# Patient Record
Sex: Male | Born: 1965
Health system: Southern US, Community
[De-identification: ages and names within clinical notes are randomized; demographics above are authoritative.]

## PROBLEM LIST (undated history)

## (undated) DIAGNOSIS — D6851 Activated protein C resistance: Secondary | ICD-10-CM

## (undated) DIAGNOSIS — K219 Gastro-esophageal reflux disease without esophagitis: Secondary | ICD-10-CM

## (undated) DIAGNOSIS — T7840XA Allergy, unspecified, initial encounter: Secondary | ICD-10-CM

## (undated) DIAGNOSIS — I739 Peripheral vascular disease, unspecified: Secondary | ICD-10-CM

## (undated) DIAGNOSIS — F419 Anxiety disorder, unspecified: Secondary | ICD-10-CM

## (undated) HISTORY — PX: APPENDECTOMY: SHX54

## (undated) HISTORY — PX: JOINT REPLACEMENT: SHX530

## (undated) HISTORY — DX: Activated protein C resistance: D68.51

## (undated) HISTORY — DX: Allergy, unspecified, initial encounter: T78.40XA

## (undated) HISTORY — PX: OTHER SURGICAL HISTORY: SHX169

---

## 2008-07-16 ENCOUNTER — Emergency Department (HOSPITAL_COMMUNITY): Admission: EM | Admit: 2008-07-16 | Discharge: 2008-07-16 | Payer: Self-pay | Admitting: Emergency Medicine

## 2008-07-17 ENCOUNTER — Emergency Department (HOSPITAL_BASED_OUTPATIENT_CLINIC_OR_DEPARTMENT_OTHER): Admission: EM | Admit: 2008-07-17 | Discharge: 2008-07-17 | Payer: Self-pay | Admitting: Emergency Medicine

## 2008-10-09 ENCOUNTER — Emergency Department (HOSPITAL_COMMUNITY): Admission: EM | Admit: 2008-10-09 | Discharge: 2008-10-09 | Payer: Self-pay | Admitting: Emergency Medicine

## 2011-09-10 ENCOUNTER — Encounter: Payer: Self-pay | Admitting: *Deleted

## 2011-09-10 ENCOUNTER — Emergency Department (HOSPITAL_BASED_OUTPATIENT_CLINIC_OR_DEPARTMENT_OTHER)
Admission: EM | Admit: 2011-09-10 | Discharge: 2011-09-11 | Disposition: A | Discharge status: Home / Self Care | Payer: Managed Care, Other (non HMO) | Attending: Emergency Medicine | Admitting: Emergency Medicine

## 2011-09-10 ENCOUNTER — Emergency Department (INDEPENDENT_AMBULATORY_CARE_PROVIDER_SITE_OTHER): Payer: Managed Care, Other (non HMO)

## 2011-09-10 DIAGNOSIS — M7989 Other specified soft tissue disorders: Secondary | ICD-10-CM

## 2011-09-10 DIAGNOSIS — M79609 Pain in unspecified limb: Secondary | ICD-10-CM

## 2011-09-10 DIAGNOSIS — K219 Gastro-esophageal reflux disease without esophagitis: Secondary | ICD-10-CM | POA: Insufficient documentation

## 2011-09-10 DIAGNOSIS — L539 Erythematous condition, unspecified: Secondary | ICD-10-CM

## 2011-09-10 DIAGNOSIS — I809 Phlebitis and thrombophlebitis of unspecified site: Secondary | ICD-10-CM

## 2011-09-10 HISTORY — DX: Anxiety disorder, unspecified: F41.9

## 2011-09-10 HISTORY — DX: Gastro-esophageal reflux disease without esophagitis: K21.9
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
## 2011-09-10 NOTE — ED Notes (Signed)
MD in to evaluate pt now.

## 2011-09-10 NOTE — ED Notes (Signed)
Pt c/o right lower leg pain w/ "knot" to calf x 3 days

## 2011-09-18 ENCOUNTER — Emergency Department (HOSPITAL_BASED_OUTPATIENT_CLINIC_OR_DEPARTMENT_OTHER)
Admission: EM | Admit: 2011-09-18 | Discharge: 2011-09-18 | Disposition: A | Discharge status: Home / Self Care | Payer: Managed Care, Other (non HMO) | Attending: Emergency Medicine | Admitting: Emergency Medicine

## 2011-09-18 ENCOUNTER — Emergency Department (INDEPENDENT_AMBULATORY_CARE_PROVIDER_SITE_OTHER): Payer: Managed Care, Other (non HMO)

## 2011-09-18 ENCOUNTER — Encounter (HOSPITAL_BASED_OUTPATIENT_CLINIC_OR_DEPARTMENT_OTHER): Payer: Self-pay

## 2011-09-18 DIAGNOSIS — M79609 Pain in unspecified limb: Secondary | ICD-10-CM

## 2011-09-18 DIAGNOSIS — I8000 Phlebitis and thrombophlebitis of superficial vessels of unspecified lower extremity: Secondary | ICD-10-CM

## 2011-09-18 DIAGNOSIS — I809 Phlebitis and thrombophlebitis of unspecified site: Secondary | ICD-10-CM | POA: Insufficient documentation

## 2011-09-18 DIAGNOSIS — Z86718 Personal history of other venous thrombosis and embolism: Secondary | ICD-10-CM | POA: Insufficient documentation

## 2011-09-18 DIAGNOSIS — Z79899 Other long term (current) drug therapy: Secondary | ICD-10-CM | POA: Insufficient documentation

## 2011-09-18 DIAGNOSIS — D6851 Activated protein C resistance: Secondary | ICD-10-CM

## 2011-09-18 DIAGNOSIS — D6859 Other primary thrombophilia: Secondary | ICD-10-CM | POA: Insufficient documentation

## 2011-09-18 DIAGNOSIS — M7989 Other specified soft tissue disorders: Secondary | ICD-10-CM

## 2011-09-18 MED ORDER — HYDROCODONE-ACETAMINOPHEN 5-325 MG PO TABS: 1.0000 | ORAL_TABLET | Freq: Four times a day (QID) | ORAL | Status: AC | PRN

## 2011-09-18 NOTE — ED Notes (Signed)
Pt returned from US

## 2011-09-18 NOTE — ED Notes (Signed)
Pt reports he has a "clot in right leg".  States he was seen last week and pain is worse today.

## 2011-09-18 NOTE — ED Notes (Signed)
Dr Lynelle Doctor at bedside. Report received from [facility] RN. Assuming care of patient at this time

## 2011-09-18 NOTE — ED Provider Notes (Signed)
History     CSN: [id number] Arrival date & time: 09/18/2011  6:55 PM   First MD Initiated Contact with Patient 09/18/11 1914      Chief Complaint  Patient presents with  . Leg Pain  . DVT    (Consider location/radiation/quality/duration/timing/severity/associated sxs/prior treatment) HPI Patient presents to the emergency room with complaints of swelling and pain in his right leg. Patient has a history of factor V Leiden deficiency. He's been having trouble with swelling and pain in his right calf. He was seen in the emergency room  about a week ago and was diagnosed with a superficial thrombophlebitis.  Patient states the pain has been getting worse. He does not have a primary doctor or hematologist now. Patient denies any chest pain or shortness of breath. He denies any fever.   Past Medical History  Diagnosis Date  . GERD (gastroesophageal reflux disease)   . Factor IX (functional) deficiency   . Anxiety     Past Surgical History  Procedure Date  . Joint replacement   . Appendectomy     No family history on file.  History  Substance Use Topics  . Smoking status: Never Smoker   . Smokeless tobacco: Not on file  . Alcohol Use: No      Review of Systems  All other systems reviewed and are negative.    Allergies  Review of patient's allergies indicates no known allergies.  Home Medications   Current Outpatient Rx  Name Route Sig Dispense Refill  . ALPRAZOLAM 0.5 MG PO TABS Oral Take 0.5 mg by mouth daily as needed. For anxiety     . ASPIRIN 325 MG PO TBEC Oral Take 650 mg by mouth daily.      Marland Kitchen ONE-DAILY MULTI VITAMINS PO TABS Oral Take 1 tablet by mouth daily.      Marland Kitchen PANTOPRAZOLE SODIUM 40 MG PO TBEC Oral Take 40 mg by mouth daily.      Marland Kitchen ZOLPIDEM TARTRATE 10 MG PO TABS Oral Take 5 mg by mouth at bedtime as needed. For sleep       BP 125/92  Pulse 70  Temp(Src) 98.3 F (36.8 C) (Oral)  Resp 16  Ht 5\' 8"  (1.727 m)  Wt 190 lb (86.183 kg)  BMI 28.89  kg/m2  SpO2 100%  Physical Exam  Nursing note and vitals reviewed. Constitutional: He appears well-developed and well-nourished. No distress.  HENT:  Head: Normocephalic and atraumatic.  Right Ear: External ear normal.  Left Ear: External ear normal.  Eyes: Conjunctivae are normal. Right eye exhibits no discharge. Left eye exhibits no discharge. No scleral icterus.  Neck: Neck supple. No tracheal deviation present.  Cardiovascular: Normal rate, regular rhythm and intact distal pulses.   Pulmonary/Chest: Effort normal and breath sounds normal. No stridor. No respiratory distress. He has no wheezes. He has no rales.  Abdominal: Soft. Bowel sounds are normal. He exhibits no distension. There is no tenderness. There is no rebound and no guarding.  Musculoskeletal: He exhibits tenderness. He exhibits no edema.       Varicose veins noted bilateral lower extremities, tenderness palpation right calf, superficial thrombophlebitis palpated, no erythema  Neurological: He is alert. He has normal strength. No sensory deficit. Cranial nerve deficit:  no gross defecits noted. He exhibits normal muscle tone. He displays no seizure activity. Coordination normal.  Skin: Skin is warm and dry. No rash noted.  Psychiatric: He has a normal mood and affect.    ED Course  Procedures (  including critical care time)  Labs Reviewed - No data to display US Venous Img Lower Unilateral Right  09/18/2011  *RADIOLOGY REPORT*  Clinical Data: PAIN, SWELLING, RECENT SUPERFICIAL THROMBO BUT GETTING WORSE;;  RIGHT LOWER EXTREMITY VENOUS DUPLEX ULTRASOUND  Technique: Gray-scale sonography with compression, as well as color and duplex ultrasound, were performed to evaluate the deep venous system from the level of the common femoral vein through the popliteal and proximal calf veins.  Comparison: None  Findings:  Normal compressibility and normal Doppler signal within the common femoral, superficial femoral and popliteal veins,  down to the proximal calf veins.  No grayscale filling defects to suggest DVT.  Thrombosed superficial vein noted posteriorly in the calf, most compatible with thrombosed short saphenous vein (formally lesser saphenous vein).  IMPRESSION: No evidence of right lower extremity deep vein thrombosis.  Superficial thrombophlebitis of the right posterior calf short saphenous vein.  Original Report Authenticated By: Cyndie Chime, M.D.   US Venous Img Lower Unilateral Right  09/10/2011  *RADIOLOGY REPORT*  Clinical Data: Right calf pain, swelling and erythema.  History of IVC filter.  Question DVT.  RIGHT LOWER EXTREMITY VENOUS DUPLEX ULTRASOUND  Technique:  Gray-scale sonography with graded compression, as well as color Doppler and duplex ultrasound were performed to evaluate the deep venous system of the lower extremity from the level of the common femoral vein through the popliteal and proximal calf veins. Spectral Doppler was utilized to evaluate flow at rest and with distal augmentation maneuvers.  Comparison:  None.  Findings:  Normal compressibility of the common femoral, superficial femoral, and popliteal veins is demonstrated, as well as the visualized proximal calf veins.  No filling defects to suggest DVT on grayscale or color Doppler imaging.  Doppler waveforms show normal direction of venous flow, normal respiratory phasicity and response to augmentation.  IMPRESSION: No evidence of right lower extremity deep vein thrombosis.  Original Report Authenticated By: Gerrianne Scale, M.D.    1. Superficial thrombophlebitis   2. Factor V Leiden       MDM  Patient does not have any signs of deep venous thrombosis. He continues to have however a superficial thrombophlebitis. She will continue the aspirin. Considering his history of factor V Leiden I do recommend a followup with a hematologist.        Celene Kras, MD 09/18/11 2049

## 2011-11-21 ENCOUNTER — Ambulatory Visit (HOSPITAL_BASED_OUTPATIENT_CLINIC_OR_DEPARTMENT_OTHER): Payer: Managed Care, Other (non HMO) | Attending: Emergency Medicine

## 2011-11-21 VITALS — Ht 68.0 in | Wt 190.0 lb

## 2011-11-21 DIAGNOSIS — G4733 Obstructive sleep apnea (adult) (pediatric): Secondary | ICD-10-CM

## 2011-11-24 DIAGNOSIS — R0609 Other forms of dyspnea: Secondary | ICD-10-CM

## 2011-11-24 DIAGNOSIS — G4733 Obstructive sleep apnea (adult) (pediatric): Secondary | ICD-10-CM

## 2011-11-24 DIAGNOSIS — R0989 Other specified symptoms and signs involving the circulatory and respiratory systems: Secondary | ICD-10-CM

## 2011-11-25 NOTE — Procedures (Signed)
NAME:  Andrew Goodman, Andrew Goodman            ACCOUNT NO.:  [id number]  MEDICAL RECORD NO.:  [id number]          PATIENT TYPE:  OUT  LOCATION:  SLEEP CENTER                 FACILITY[facility]  [facility]  PHYSICIAN:  Yanet Balliet D. Maple Hudson, MD, FCCP, FACPDATE OF BIRTH:  05-31-1965  DATE OF STUDY:                           NOCTURNAL POLYSOMNOGRAM  REFERRING PHYSICIAN:  JOHN LONGPHRE  INDICATION FOR STUDY:  Insomnia with sleep apnea.  EPWORTH SLEEPINESS SCORE:  4/24.  BMI 28.  Weight 190 pounds.  Height 68 inches.  Neck 16 inch.  MEDICATIONS:  Home medications are charted and reviewed.  SLEEP ARCHITECTURE:  Total sleep time 333.5 minutes with sleep efficiency 90.4%.  Stage I was 4.9%, stage II 82.3%, stage III absent. REM 12.7% of total sleep time.  Sleep latency 2.5 minutes.  REM latency 95.5 minutes.  Awake after sleep onset 33 minutes.  Arousal index 5.9.  BEDTIME MEDICATION:  None.  RESPIRATORY DATA:  Apnea-hypopnea index (AHI) 6.1 per hour.  A total of 34 events were scored, all as hypopneas associated with supine sleep position, and REM.  REM AHI 18.4 per hour.  There were insufficient numbers of events to permit application of split protocol CPAP titration on the study night.  OXYGEN DATA:  Moderately loud snoring with oxygen desaturation to a nadir of 82% and a mean oxygen saturation through the study of 93.2% on room air.  CARDIAC DATA:  Normal sinus rhythm.  MOVEMENT-PARASOMNIA:  No significant movement disturbance.  Bathroom x1.  IMPRESSIONS-RECOMMENDATIONS: 1. Mild obstructive sleep apnea-hypopnea syndrome, apnea-hypopnea     index 6.1 per hour with supine events, particularly frequent in     rapid eye movement.  Rapid Eye Movement apnea-hypopnea index 18.4     per hour.  Moderately loud snoring with oxygen desaturation to a     nadir of 82% and a mean oxygen saturation through the study of     93.2% on room air. 2. Scores in this range are not usually addressed with CPAP unless  conservative measures are insufficient.  Consider management for     nasal obstruction if appropriate, encouragement to sleep off flat     of back.     Angelise Petrich D. Maple Hudson, MD, [facility], FACP Diplomate, American Board of Sleep Medicine    CDY/MEDQ  D:  11/24/2011 12:31:17  T:  11/25/2011 03:20:38  Job:  [id number]

## 2013-01-05 ENCOUNTER — Encounter: Payer: Self-pay | Admitting: Cardiology

## 2013-01-05 ENCOUNTER — Encounter: Payer: Self-pay | Admitting: *Deleted

## 2013-01-05 DIAGNOSIS — D67 Hereditary factor IX deficiency: Secondary | ICD-10-CM | POA: Insufficient documentation

## 2013-01-05 DIAGNOSIS — K219 Gastro-esophageal reflux disease without esophagitis: Secondary | ICD-10-CM | POA: Insufficient documentation

## 2013-01-05 DIAGNOSIS — F419 Anxiety disorder, unspecified: Secondary | ICD-10-CM | POA: Insufficient documentation

## 2013-01-06 ENCOUNTER — Encounter: Payer: Self-pay | Admitting: Cardiology

## 2013-01-06 ENCOUNTER — Ambulatory Visit (INDEPENDENT_AMBULATORY_CARE_PROVIDER_SITE_OTHER): Payer: Managed Care, Other (non HMO) | Admitting: Cardiology

## 2013-01-06 VITALS — BP 124/78 | HR 75 | Wt 195.0 lb

## 2013-01-06 DIAGNOSIS — R079 Chest pain, unspecified: Secondary | ICD-10-CM

## 2013-01-06 NOTE — Patient Instructions (Addendum)
Your physician recommends that you schedule a follow-up appointment in: AS NEEDED  Your physician recommends that you HAVE LAB WORK TODAY 

## 2013-01-06 NOTE — Assessment & Plan Note (Signed)
Symptoms are not consistent with cardiac etiology.he does have a history of factor V Leiden. I doubt pulmonary embolus. However we will arrange a d-dimer. If elevated we will pursue a CT scan. Otherwise unless he has further symptoms I would recommend no further evaluation.
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
## 2013-01-07 ENCOUNTER — Other Ambulatory Visit: Payer: Self-pay | Admitting: *Deleted

## 2013-01-07 ENCOUNTER — Telehealth: Payer: Self-pay | Admitting: Cardiology

## 2013-01-07 ENCOUNTER — Other Ambulatory Visit (INDEPENDENT_AMBULATORY_CARE_PROVIDER_SITE_OTHER): Payer: Managed Care, Other (non HMO)

## 2013-01-07 DIAGNOSIS — R7989 Other specified abnormal findings of blood chemistry: Secondary | ICD-10-CM

## 2013-01-07 DIAGNOSIS — R791 Abnormal coagulation profile: Secondary | ICD-10-CM

## 2013-01-07 LAB — BASIC METABOLIC PANEL
CO2: 27 mEq/L (ref 19–32)
GFR: 95.74 mL/min (ref >60.00)
Glucose, Bld: 129 mg/dL — ABNORMAL HIGH (ref 70–99)
Potassium: 3.5 mEq/L (ref 3.5–5.1)
Sodium: 136 mEq/L (ref 135–145)

## 2013-01-07 NOTE — Telephone Encounter (Signed)
Follow up call   Returning call back to nurse  

## 2013-01-07 NOTE — Telephone Encounter (Signed)
Spoke with pt, he will come by the office today for a stat bmp. Once those results are received the CTA will be scheduled.

## 2013-01-08 ENCOUNTER — Ambulatory Visit (INDEPENDENT_AMBULATORY_CARE_PROVIDER_SITE_OTHER)
Admission: RE | Admit: 2013-01-08 | Discharge: 2013-01-08 | Disposition: A | Discharge status: Home / Self Care | Payer: Managed Care, Other (non HMO) | Source: Ambulatory Visit | Attending: Cardiology | Admitting: Cardiology

## 2013-01-08 DIAGNOSIS — R7989 Other specified abnormal findings of blood chemistry: Secondary | ICD-10-CM

## 2013-01-08 DIAGNOSIS — R791 Abnormal coagulation profile: Secondary | ICD-10-CM

## 2013-01-08 MED ORDER — IOHEXOL 350 MG/ML SOLN
80.0000 mL | Freq: Once | INTRAVENOUS | Status: AC | PRN
  Administered 2013-01-08: 80 mL via INTRAVENOUS

## 2013-01-08 NOTE — Telephone Encounter (Signed)
Spoke with pt, bmp was normal. The pt will come to the office today @ 3:30PM for CTA of chest for PE

## 2017-02-08 DIAGNOSIS — J309 Allergic rhinitis, unspecified: Secondary | ICD-10-CM | POA: Diagnosis not present

## 2017-02-18 DIAGNOSIS — J309 Allergic rhinitis, unspecified: Secondary | ICD-10-CM | POA: Diagnosis not present

## 2017-02-18 DIAGNOSIS — Z7901 Long term (current) use of anticoagulants: Secondary | ICD-10-CM | POA: Diagnosis not present

## 2017-02-18 DIAGNOSIS — D6851 Activated protein C resistance: Secondary | ICD-10-CM | POA: Diagnosis not present

## 2017-02-18 DIAGNOSIS — K219 Gastro-esophageal reflux disease without esophagitis: Secondary | ICD-10-CM | POA: Diagnosis not present

## 2017-02-18 DIAGNOSIS — F4010 Social phobia, unspecified: Secondary | ICD-10-CM | POA: Diagnosis not present

## 2017-04-13 DIAGNOSIS — D6851 Activated protein C resistance: Secondary | ICD-10-CM | POA: Diagnosis not present

## 2017-04-13 DIAGNOSIS — M79604 Pain in right leg: Secondary | ICD-10-CM | POA: Diagnosis not present

## 2017-04-13 DIAGNOSIS — I803 Phlebitis and thrombophlebitis of lower extremities, unspecified: Secondary | ICD-10-CM | POA: Diagnosis not present

## 2017-04-13 DIAGNOSIS — I82403 Acute embolism and thrombosis of unspecified deep veins of lower extremity, bilateral: Secondary | ICD-10-CM | POA: Diagnosis not present

## 2017-04-13 DIAGNOSIS — M79605 Pain in left leg: Secondary | ICD-10-CM | POA: Diagnosis not present

## 2017-04-13 DIAGNOSIS — G8918 Other acute postprocedural pain: Secondary | ICD-10-CM | POA: Diagnosis not present

## 2017-04-13 DIAGNOSIS — I82401 Acute embolism and thrombosis of unspecified deep veins of right lower extremity: Secondary | ICD-10-CM | POA: Diagnosis not present

## 2017-04-13 DIAGNOSIS — M7989 Other specified soft tissue disorders: Secondary | ICD-10-CM | POA: Diagnosis not present

## 2017-04-13 DIAGNOSIS — I82402 Acute embolism and thrombosis of unspecified deep veins of left lower extremity: Secondary | ICD-10-CM | POA: Diagnosis not present

## 2017-04-13 DIAGNOSIS — R600 Localized edema: Secondary | ICD-10-CM | POA: Diagnosis not present

## 2017-04-29 ENCOUNTER — Emergency Department (HOSPITAL_COMMUNITY)
Admission: EM | Admit: 2017-04-29 | Discharge: 2017-04-30 | Disposition: A | Discharge status: Home / Self Care | Payer: BLUE CROSS/BLUE SHIELD | Attending: Emergency Medicine | Admitting: Emergency Medicine

## 2017-04-29 ENCOUNTER — Encounter (HOSPITAL_COMMUNITY): Payer: Self-pay | Admitting: Emergency Medicine

## 2017-04-29 DIAGNOSIS — I824Z2 Acute embolism and thrombosis of unspecified deep veins of left distal lower extremity: Secondary | ICD-10-CM | POA: Diagnosis not present

## 2017-04-29 DIAGNOSIS — I82409 Acute embolism and thrombosis of unspecified deep veins of unspecified lower extremity: Secondary | ICD-10-CM | POA: Diagnosis not present

## 2017-04-29 DIAGNOSIS — Z79899 Other long term (current) drug therapy: Secondary | ICD-10-CM | POA: Insufficient documentation

## 2017-04-29 DIAGNOSIS — D682 Hereditary deficiency of other clotting factors: Secondary | ICD-10-CM | POA: Diagnosis not present

## 2017-04-29 DIAGNOSIS — I82402 Acute embolism and thrombosis of unspecified deep veins of left lower extremity: Secondary | ICD-10-CM | POA: Diagnosis not present

## 2017-04-29 DIAGNOSIS — M79605 Pain in left leg: Secondary | ICD-10-CM | POA: Diagnosis present

## 2017-04-29 LAB — CBC WITH DIFFERENTIAL/PLATELET
BASOS PCT: 0 %
Basophils Absolute: 0 10*3/uL (ref 0.0–0.1)
EOS ABS: 0.4 10*3/uL (ref 0.0–0.7)
Eosinophils Relative: 5 %
HCT: 42.9 % (ref 39.0–52.0)
Hemoglobin: 14.4 g/dL (ref 13.0–17.0)
LYMPHS ABS: 2.2 10*3/uL (ref 0.7–4.0)
LYMPHS PCT: 32 %
MCH: 29.2 pg (ref 26.0–34.0)
MCHC: 33.6 g/dL (ref 30.0–36.0)
MCV: 87 fL (ref 78.0–100.0)
MONO ABS: 0.5 10*3/uL (ref 0.1–1.0)
Monocytes Relative: 7 %
NEUTROS ABS: 3.9 10*3/uL (ref 1.7–7.7)
Neutrophils Relative %: 56 %
Platelets: 220 10*3/uL (ref 150–400)
RBC: 4.93 MIL/uL (ref 4.22–5.81)
RDW: 13.9 % (ref 11.5–15.5)
WBC: 6.9 10*3/uL (ref 4.0–10.5)

## 2017-04-29 LAB — COMPREHENSIVE METABOLIC PANEL
ALBUMIN: 4 g/dL (ref 3.5–5.0)
ALK PHOS: 64 U/L (ref 38–126)
ALT: 20 U/L (ref 17–63)
ANION GAP: 8 (ref 5–15)
AST: 22 U/L (ref 15–41)
BUN: 12 mg/dL (ref 6–20)
CHLORIDE: 105 mmol/L (ref 101–111)
CO2: 24 mmol/L (ref 22–32)
Calcium: 8.7 mg/dL — ABNORMAL LOW (ref 8.9–10.3)
Creatinine, Ser: 0.96 mg/dL (ref 0.61–1.24)
GFR calc Af Amer: >60 mL/min (ref >60)
GFR calc non Af Amer: >60 mL/min (ref >60)
GLUCOSE: 126 mg/dL — AB (ref 65–99)
POTASSIUM: 3.8 mmol/L (ref 3.5–5.1)
SODIUM: 137 mmol/L (ref 135–145)
Total Bilirubin: 0.5 mg/dL (ref 0.3–1.2)
Total Protein: 6.9 g/dL (ref 6.5–8.1)

## 2017-04-29 LAB — PROTIME-INR
INR: 0.93
Prothrombin Time: 12.5 seconds (ref 11.4–15.2)

## 2017-04-29 LAB — APTT: aPTT: 29 seconds (ref 24–36)
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
## 2017-04-29 NOTE — ED Triage Notes (Signed)
Pt [facility] ED [facility] [place] [facility]  Pt has Factor V and hx of clots, has a groin stent on left and right, plus a central filter.  Pt is experiencing pain to the left groin, left thigh and lower abdomen, mild in nature, but [facility] [place] [facility] [place].  Has been unable to get in with PCP.  Pt denies CP, SOB, HR stable.  Pt is on Eliquis/

## 2017-04-30 ENCOUNTER — Emergency Department (HOSPITAL_COMMUNITY)
Admission: EM | Admit: 2017-04-30 | Discharge: 2017-04-30 | Disposition: A | Discharge status: Home / Self Care | Payer: BLUE CROSS/BLUE SHIELD | Source: Home / Self Care | Attending: Emergency Medicine | Admitting: Emergency Medicine

## 2017-04-30 ENCOUNTER — Telehealth: Payer: Self-pay | Admitting: Surgery

## 2017-04-30 ENCOUNTER — Encounter (HOSPITAL_COMMUNITY): Payer: Self-pay | Admitting: Emergency Medicine

## 2017-04-30 ENCOUNTER — Other Ambulatory Visit: Payer: Self-pay

## 2017-04-30 ENCOUNTER — Emergency Department (HOSPITAL_BASED_OUTPATIENT_CLINIC_OR_DEPARTMENT_OTHER)
Admit: 2017-04-30 | Discharge: 2017-04-30 | Disposition: A | Discharge status: Home / Self Care | Payer: BLUE CROSS/BLUE SHIELD | Attending: Emergency Medicine | Admitting: Emergency Medicine

## 2017-04-30 ENCOUNTER — Other Ambulatory Visit: Payer: Self-pay | Admitting: *Deleted

## 2017-04-30 DIAGNOSIS — Z7901 Long term (current) use of anticoagulants: Secondary | ICD-10-CM | POA: Insufficient documentation

## 2017-04-30 DIAGNOSIS — I82422 Acute embolism and thrombosis of left iliac vein: Secondary | ICD-10-CM

## 2017-04-30 DIAGNOSIS — Z95828 Presence of other vascular implants and grafts: Secondary | ICD-10-CM

## 2017-04-30 DIAGNOSIS — Z79899 Other long term (current) drug therapy: Secondary | ICD-10-CM

## 2017-04-30 DIAGNOSIS — Z86718 Personal history of other venous thrombosis and embolism: Secondary | ICD-10-CM | POA: Diagnosis not present

## 2017-04-30 DIAGNOSIS — I82402 Acute embolism and thrombosis of unspecified deep veins of left lower extremity: Secondary | ICD-10-CM | POA: Insufficient documentation

## 2017-04-30 DIAGNOSIS — M7989 Other specified soft tissue disorders: Secondary | ICD-10-CM

## 2017-04-30 DIAGNOSIS — I824Z2 Acute embolism and thrombosis of unspecified deep veins of left distal lower extremity: Secondary | ICD-10-CM | POA: Diagnosis not present

## 2017-04-30 MED ORDER — ENOXAPARIN SODIUM 100 MG/ML ~~LOC~~ SOLN
90.0000 mg | Freq: Once | SUBCUTANEOUS | Status: AC
  Administered 2017-04-30: 90 mg via SUBCUTANEOUS
  Filled 2017-04-30: qty 1

## 2017-04-30 MED ORDER — APIXABAN 5 MG PO TABS: ORAL_TABLET | ORAL | 0 refills | Status: DC

## 2017-04-30 NOTE — ED Notes (Signed)
Instructed pt to report to Vascular. Noreene LarssonJill, from the vascular lab called and is ready for the pt.

## 2017-04-30 NOTE — Telephone Encounter (Signed)
New pt appt was already sched for 06/06/17 at 9:00. Added the aorto-illiac and IVC 06/04/17 at 8:30.

## 2017-04-30 NOTE — Telephone Encounter (Signed)
-----   Message from Sharee PimpleMarilyn K McChesney, RN sent at 04/30/2017 12:07 PM EDT ----- Regarding: new patient appt with 2 labs   ----- Message ----- From: Nada LibmanBrabham, Vance W, MD Sent: 04/30/2017  11:37 AM To: Vvs Charge Pool  Please have patient come in for a new patient eval for recurrent DVT and Factor V leiden mutation.  History of iliac vein stents and IVC filter.  Needs Iliac vein and IVC u/c prior

## 2017-04-30 NOTE — ED Triage Notes (Signed)
Pt. Stated, I was here yesterday and there was noone here to do the study and told to come back this morning. [facility] and told to follow up at home.
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
## 2017-04-30 NOTE — Progress Notes (Signed)
*  PRELIMINARY RESULTS* Vascular Ultrasound Left lower extremity venous duplex has been completed.  Preliminary findings: DVT noted in the left popliteal, posterior tibial, and peroneal veins. Intramuscular thrombosis of the left gastroc veins. Superficial thrombosis of varicose veins in the calf.   [facility] that I will bring patient back to ED waiting room.   Farrel DemarkJill Eunice, RDMS, RVT  04/30/2017, 8:39 AM

## 2017-04-30 NOTE — Discharge Instructions (Signed)
Return to the emergency department tomorrow morning for venous duplex and ed management.   Do not hesitate to return to the emergency department for any new, worsening or concerning symptoms.

## 2017-05-24 DIAGNOSIS — Z125 Encounter for screening for malignant neoplasm of prostate: Secondary | ICD-10-CM | POA: Diagnosis not present

## 2017-05-24 DIAGNOSIS — Z13220 Encounter for screening for lipoid disorders: Secondary | ICD-10-CM | POA: Diagnosis not present

## 2017-05-24 DIAGNOSIS — Z0000 Encounter for general adult medical examination without abnormal findings: Secondary | ICD-10-CM | POA: Diagnosis not present

## 2017-05-30 ENCOUNTER — Encounter: Payer: Self-pay | Admitting: Vascular Surgery

## 2017-06-04 ENCOUNTER — Ambulatory Visit (HOSPITAL_COMMUNITY)
Admission: RE | Admit: 2017-06-04 | Discharge: 2017-06-04 | Disposition: A | Discharge status: Home / Self Care | Payer: BLUE CROSS/BLUE SHIELD | Source: Ambulatory Visit | Attending: Vascular Surgery | Admitting: Vascular Surgery

## 2017-06-04 ENCOUNTER — Encounter (HOSPITAL_COMMUNITY): Payer: BLUE CROSS/BLUE SHIELD

## 2017-06-04 DIAGNOSIS — Z95828 Presence of other vascular implants and grafts: Secondary | ICD-10-CM

## 2017-06-06 ENCOUNTER — Ambulatory Visit (INDEPENDENT_AMBULATORY_CARE_PROVIDER_SITE_OTHER): Payer: BLUE CROSS/BLUE SHIELD | Admitting: Vascular Surgery

## 2017-06-06 ENCOUNTER — Encounter: Payer: Self-pay | Admitting: Vascular Surgery

## 2017-06-06 VITALS — BP 131/86 | HR 67 | Temp 97.0°F | Ht 68.0 in | Wt 196.0 lb

## 2017-06-06 DIAGNOSIS — I872 Venous insufficiency (chronic) (peripheral): Secondary | ICD-10-CM | POA: Diagnosis not present
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
## 2017-06-12 NOTE — Addendum Note (Signed)
Addended by: Burton ApleyPETTY, Hani Patnode A on: 06/12/2017 03:47 PM   Modules accepted: Orders

## 2018-06-04 ENCOUNTER — Ambulatory Visit: Payer: 59 | Admitting: Vascular Surgery

## 2018-06-04 ENCOUNTER — Encounter: Payer: Self-pay | Admitting: Vascular Surgery

## 2018-06-04 ENCOUNTER — Other Ambulatory Visit: Payer: Self-pay

## 2018-06-04 VITALS — BP 122/92 | HR 67 | Temp 98.4°F | Resp 16 | Ht 68.0 in | Wt 201.0 lb

## 2018-06-04 DIAGNOSIS — I872 Venous insufficiency (chronic) (peripheral): Secondary | ICD-10-CM | POA: Diagnosis not present
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
## 2019-05-01 ENCOUNTER — Other Ambulatory Visit: Payer: Self-pay | Admitting: Vascular Surgery

## 2019-05-01 DIAGNOSIS — I872 Venous insufficiency (chronic) (peripheral): Secondary | ICD-10-CM

## 2019-05-01 DIAGNOSIS — Z95828 Presence of other vascular implants and grafts: Secondary | ICD-10-CM

## 2019-05-08 ENCOUNTER — Other Ambulatory Visit: Payer: Self-pay | Admitting: Vascular Surgery

## 2019-05-08 DIAGNOSIS — I872 Venous insufficiency (chronic) (peripheral): Secondary | ICD-10-CM

## 2019-05-08 DIAGNOSIS — Z95828 Presence of other vascular implants and grafts: Secondary | ICD-10-CM

## 2019-05-08 DIAGNOSIS — I82422 Acute embolism and thrombosis of left iliac vein: Secondary | ICD-10-CM

## 2019-05-22 ENCOUNTER — Ambulatory Visit
Admission: RE | Admit: 2019-05-22 | Discharge: 2019-05-22 | Disposition: A | Discharge status: Home / Self Care | Payer: 59 | Source: Ambulatory Visit | Attending: Vascular Surgery | Admitting: Vascular Surgery

## 2019-05-22 DIAGNOSIS — I82422 Acute embolism and thrombosis of left iliac vein: Secondary | ICD-10-CM

## 2019-05-22 DIAGNOSIS — I872 Venous insufficiency (chronic) (peripheral): Secondary | ICD-10-CM

## 2019-05-22 DIAGNOSIS — Z95828 Presence of other vascular implants and grafts: Secondary | ICD-10-CM

## 2019-06-04 ENCOUNTER — Other Ambulatory Visit: Payer: Self-pay

## 2019-06-04 ENCOUNTER — Encounter: Payer: Self-pay | Admitting: Vascular Surgery

## 2019-06-04 ENCOUNTER — Ambulatory Visit (INDEPENDENT_AMBULATORY_CARE_PROVIDER_SITE_OTHER): Payer: BC Managed Care – PPO | Admitting: Vascular Surgery

## 2019-06-04 VITALS — Ht 68.0 in | Wt 200.0 lb

## 2019-06-04 DIAGNOSIS — I872 Venous insufficiency (chronic) (peripheral): Secondary | ICD-10-CM | POA: Diagnosis not present

## 2019-06-04 DIAGNOSIS — Z95828 Presence of other vascular implants and grafts: Secondary | ICD-10-CM | POA: Diagnosis not present

## 2019-06-04 DIAGNOSIS — I82422 Acute embolism and thrombosis of left iliac vein: Secondary | ICD-10-CM
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
## 2019-10-20 DIAGNOSIS — M545 Low back pain: Secondary | ICD-10-CM | POA: Diagnosis not present

## 2019-10-22 DIAGNOSIS — M545 Low back pain: Secondary | ICD-10-CM | POA: Diagnosis not present

## 2019-10-22 DIAGNOSIS — M6281 Muscle weakness (generalized): Secondary | ICD-10-CM | POA: Diagnosis not present

## 2019-10-29 DIAGNOSIS — M545 Low back pain: Secondary | ICD-10-CM | POA: Diagnosis not present

## 2019-10-29 DIAGNOSIS — M6281 Muscle weakness (generalized): Secondary | ICD-10-CM | POA: Diagnosis not present

## 2019-11-04 DIAGNOSIS — M6281 Muscle weakness (generalized): Secondary | ICD-10-CM | POA: Diagnosis not present

## 2019-11-04 DIAGNOSIS — M545 Low back pain: Secondary | ICD-10-CM | POA: Diagnosis not present

## 2019-11-10 DIAGNOSIS — M6281 Muscle weakness (generalized): Secondary | ICD-10-CM | POA: Diagnosis not present

## 2019-11-10 DIAGNOSIS — M545 Low back pain: Secondary | ICD-10-CM | POA: Diagnosis not present

## 2019-11-12 DIAGNOSIS — M545 Low back pain: Secondary | ICD-10-CM | POA: Diagnosis not present

## 2019-11-12 DIAGNOSIS — M6281 Muscle weakness (generalized): Secondary | ICD-10-CM | POA: Diagnosis not present

## 2019-11-17 DIAGNOSIS — M545 Low back pain: Secondary | ICD-10-CM | POA: Diagnosis not present

## 2019-11-17 DIAGNOSIS — M6281 Muscle weakness (generalized): Secondary | ICD-10-CM | POA: Diagnosis not present

## 2019-11-19 DIAGNOSIS — M545 Low back pain: Secondary | ICD-10-CM | POA: Diagnosis not present

## 2019-11-19 DIAGNOSIS — M6281 Muscle weakness (generalized): Secondary | ICD-10-CM | POA: Diagnosis not present

## 2020-01-20 DIAGNOSIS — K219 Gastro-esophageal reflux disease without esophagitis: Secondary | ICD-10-CM | POA: Diagnosis not present

## 2020-01-20 DIAGNOSIS — J309 Allergic rhinitis, unspecified: Secondary | ICD-10-CM | POA: Diagnosis not present

## 2020-01-20 DIAGNOSIS — D6851 Activated protein C resistance: Secondary | ICD-10-CM | POA: Diagnosis not present

## 2020-01-20 DIAGNOSIS — J342 Deviated nasal septum: Secondary | ICD-10-CM | POA: Diagnosis not present

## 2020-02-25 DIAGNOSIS — Z13220 Encounter for screening for lipoid disorders: Secondary | ICD-10-CM | POA: Diagnosis not present

## 2020-02-25 DIAGNOSIS — Z0000 Encounter for general adult medical examination without abnormal findings: Secondary | ICD-10-CM | POA: Diagnosis not present

## 2020-02-25 DIAGNOSIS — Z125 Encounter for screening for malignant neoplasm of prostate: Secondary | ICD-10-CM | POA: Diagnosis not present

## 2020-04-22 DIAGNOSIS — Z86718 Personal history of other venous thrombosis and embolism: Secondary | ICD-10-CM | POA: Diagnosis not present

## 2020-04-22 DIAGNOSIS — K219 Gastro-esophageal reflux disease without esophagitis: Secondary | ICD-10-CM | POA: Diagnosis not present

## 2020-05-04 ENCOUNTER — Other Ambulatory Visit: Payer: Self-pay | Admitting: Gastroenterology

## 2020-05-24 DIAGNOSIS — K2940 Chronic atrophic gastritis without bleeding: Secondary | ICD-10-CM | POA: Diagnosis not present

## 2020-05-24 DIAGNOSIS — K317 Polyp of stomach and duodenum: Secondary | ICD-10-CM | POA: Diagnosis not present

## 2020-05-24 DIAGNOSIS — K219 Gastro-esophageal reflux disease without esophagitis: Secondary | ICD-10-CM | POA: Diagnosis not present

## 2020-05-24 DIAGNOSIS — K449 Diaphragmatic hernia without obstruction or gangrene: Secondary | ICD-10-CM | POA: Diagnosis not present

## 2020-05-24 DIAGNOSIS — K2950 Unspecified chronic gastritis without bleeding: Secondary | ICD-10-CM | POA: Diagnosis not present

## 2020-05-25 ENCOUNTER — Ambulatory Visit
Admission: RE | Admit: 2020-05-25 | Discharge: 2020-05-25 | Disposition: A | Discharge status: Home / Self Care | Payer: BC Managed Care – PPO | Source: Ambulatory Visit | Attending: Gastroenterology | Admitting: Gastroenterology

## 2020-05-25 ENCOUNTER — Other Ambulatory Visit: Payer: Self-pay | Admitting: Gastroenterology

## 2020-05-25 DIAGNOSIS — S42002A Fracture of unspecified part of left clavicle, initial encounter for closed fracture: Secondary | ICD-10-CM | POA: Diagnosis not present

## 2020-05-25 DIAGNOSIS — K6389 Other specified diseases of intestine: Secondary | ICD-10-CM | POA: Diagnosis not present

## 2020-05-25 DIAGNOSIS — R109 Unspecified abdominal pain: Secondary | ICD-10-CM

## 2020-05-25 DIAGNOSIS — J984 Other disorders of lung: Secondary | ICD-10-CM | POA: Diagnosis not present

## 2020-06-29 ENCOUNTER — Other Ambulatory Visit: Payer: Self-pay | Admitting: Gastroenterology

## 2020-06-29 ENCOUNTER — Other Ambulatory Visit (HOSPITAL_COMMUNITY): Payer: Self-pay | Admitting: Gastroenterology

## 2020-06-29 DIAGNOSIS — K3189 Other diseases of stomach and duodenum: Secondary | ICD-10-CM | POA: Diagnosis not present

## 2020-06-29 DIAGNOSIS — K219 Gastro-esophageal reflux disease without esophagitis: Secondary | ICD-10-CM | POA: Diagnosis not present

## 2020-06-29 DIAGNOSIS — Z86718 Personal history of other venous thrombosis and embolism: Secondary | ICD-10-CM | POA: Diagnosis not present

## 2020-07-19 ENCOUNTER — Other Ambulatory Visit: Payer: BC Managed Care – PPO

## 2020-07-21 ENCOUNTER — Other Ambulatory Visit: Payer: Self-pay

## 2020-07-21 ENCOUNTER — Ambulatory Visit (HOSPITAL_COMMUNITY)
Admission: RE | Admit: 2020-07-21 | Discharge: 2020-07-21 | Disposition: A | Discharge status: Home / Self Care | Payer: BC Managed Care – PPO | Source: Ambulatory Visit | Attending: Gastroenterology | Admitting: Gastroenterology

## 2020-07-21 DIAGNOSIS — K219 Gastro-esophageal reflux disease without esophagitis: Secondary | ICD-10-CM | POA: Diagnosis not present

## 2020-07-21 DIAGNOSIS — R112 Nausea with vomiting, unspecified: Secondary | ICD-10-CM | POA: Diagnosis not present

## 2020-07-21 MED ORDER — TECHNETIUM TC 99M SULFUR COLLOID
2.1100 | Freq: Once | INTRAVENOUS | Status: AC | PRN
  Administered 2020-07-21: 2.11 via INTRAVENOUS

## 2020-08-10 ENCOUNTER — Ambulatory Visit: Payer: BC Managed Care – PPO | Admitting: Vascular Surgery

## 2020-08-10 ENCOUNTER — Encounter (HOSPITAL_COMMUNITY): Payer: BC Managed Care – PPO

## 2020-08-23 ENCOUNTER — Other Ambulatory Visit (HOSPITAL_COMMUNITY)
Admission: RE | Admit: 2020-08-23 | Discharge: 2020-08-23 | Disposition: A | Discharge status: Home / Self Care | Payer: BC Managed Care – PPO | Source: Ambulatory Visit | Attending: Gastroenterology | Admitting: Gastroenterology

## 2020-08-23 DIAGNOSIS — Z01812 Encounter for preprocedural laboratory examination: Secondary | ICD-10-CM | POA: Insufficient documentation

## 2020-08-23 DIAGNOSIS — R12 Heartburn: Secondary | ICD-10-CM | POA: Diagnosis not present

## 2020-08-23 DIAGNOSIS — Z20822 Contact with and (suspected) exposure to covid-19: Secondary | ICD-10-CM | POA: Insufficient documentation

## 2020-08-23 LAB — SARS CORONAVIRUS 2 (TAT 6-24 HRS): SARS Coronavirus 2: NEGATIVE

## 2020-08-24 ENCOUNTER — Ambulatory Visit (HOSPITAL_COMMUNITY)
Admission: RE | Admit: 2020-08-24 | Discharge: 2020-08-24 | Disposition: A | Discharge status: Home / Self Care | Payer: BC Managed Care – PPO | Attending: Gastroenterology | Admitting: Gastroenterology

## 2020-08-24 ENCOUNTER — Encounter (HOSPITAL_COMMUNITY): Payer: Self-pay | Admitting: Gastroenterology

## 2020-08-24 ENCOUNTER — Encounter (HOSPITAL_COMMUNITY)
Admission: RE | Disposition: A | Discharge status: Home / Self Care | Payer: Self-pay | Source: Home / Self Care | Attending: Gastroenterology

## 2020-08-24 DIAGNOSIS — R12 Heartburn: Secondary | ICD-10-CM | POA: Insufficient documentation

## 2020-08-24 DIAGNOSIS — Z20822 Contact with and (suspected) exposure to covid-19: Secondary | ICD-10-CM | POA: Diagnosis not present

## 2020-08-24 HISTORY — PX: ESOPHAGEAL MANOMETRY: SHX5429

## 2020-08-24 HISTORY — PX: 24 HOUR PH STUDY: SHX5419

## 2020-08-24 SURGERY — MANOMETRY, ESOPHAGUS

## 2020-08-24 MED ORDER — LIDOCAINE VISCOUS HCL 2 % MT SOLN
OROMUCOSAL | Status: AC
  Filled 2020-08-24: qty 15

## 2020-08-24 SURGICAL SUPPLY — 2 items
FACESHIELD LNG OPTICON STERILE (SAFETY) IMPLANT
GLOVE BIO SURGEON STRL SZ8 (GLOVE) ×4 IMPLANT

## 2020-08-24 NOTE — Progress Notes (Signed)
Esophageal manometry performed per protocol.  Patient tolerated procedure without any diffuculties.  PH probe then placed at 41 cm at right nare.  Written and verbal education provided on use of equipment and when to return to Endoscopy to have probe removed.  Patient verbalized understanding of all instructions.  Report to be sent to Dr. Charlott Rakes.

## 2020-08-25 DIAGNOSIS — Z03818 Encounter for observation for suspected exposure to other biological agents ruled out: Secondary | ICD-10-CM | POA: Diagnosis not present

## 2020-08-25 DIAGNOSIS — Z20822 Contact with and (suspected) exposure to covid-19: Secondary | ICD-10-CM | POA: Diagnosis not present

## 2020-08-28 ENCOUNTER — Encounter (HOSPITAL_COMMUNITY): Payer: Self-pay | Admitting: Gastroenterology

## 2020-08-31 ENCOUNTER — Encounter (HOSPITAL_COMMUNITY): Payer: BC Managed Care – PPO

## 2020-08-31 ENCOUNTER — Ambulatory Visit: Payer: BC Managed Care – PPO | Admitting: Vascular Surgery

## 2020-09-06 DIAGNOSIS — K219 Gastro-esophageal reflux disease without esophagitis: Secondary | ICD-10-CM | POA: Diagnosis not present

## 2020-09-13 ENCOUNTER — Other Ambulatory Visit: Payer: Self-pay

## 2020-09-13 DIAGNOSIS — D67 Hereditary factor IX deficiency: Secondary | ICD-10-CM

## 2020-09-21 ENCOUNTER — Encounter: Payer: Self-pay | Admitting: Vascular Surgery

## 2020-09-21 ENCOUNTER — Ambulatory Visit (INDEPENDENT_AMBULATORY_CARE_PROVIDER_SITE_OTHER): Payer: BC Managed Care – PPO | Admitting: Vascular Surgery

## 2020-09-21 ENCOUNTER — Ambulatory Visit (HOSPITAL_COMMUNITY)
Admission: RE | Admit: 2020-09-21 | Discharge: 2020-09-21 | Disposition: A | Discharge status: Home / Self Care | Payer: BC Managed Care – PPO | Source: Ambulatory Visit | Attending: Vascular Surgery | Admitting: Vascular Surgery

## 2020-09-21 ENCOUNTER — Other Ambulatory Visit: Payer: Self-pay

## 2020-09-21 VITALS — BP 134/86 | HR 62 | Temp 98.2°F | Resp 20 | Ht 68.0 in | Wt 194.0 lb

## 2020-09-21 DIAGNOSIS — I872 Venous insufficiency (chronic) (peripheral): Secondary | ICD-10-CM

## 2020-09-21 DIAGNOSIS — D67 Hereditary factor IX deficiency: Secondary | ICD-10-CM | POA: Diagnosis not present

## 2020-09-21 DIAGNOSIS — I82422 Acute embolism and thrombosis of left iliac vein: Secondary | ICD-10-CM | POA: Diagnosis not present

## 2020-09-21 DIAGNOSIS — Z95828 Presence of other vascular implants and grafts: Secondary | ICD-10-CM

## 2020-09-21 NOTE — Progress Notes (Signed)
REASON FOR VISIT:   History of IVC filter and history of bilateral iliac venous stents  MEDICAL ISSUES:   HISTORY OF IVC FILTER/HISTORY OF BILATERAL ILIAC VENOUS STENTS: The patient is maintained on Eliquis.  His duplex scan today looks good.  He does have some chronic clot in the left common iliac vein which is not new.  The filter is patent and his abdominal x-ray shows the filter in good position.  He remains very active.  He elevates his legs daily.  He has been wearing his knee-high compression stockings.  He also swims which I think is very helpful for patients with venous disease.  I think he should stay on his Eliquis indefinitely given his factor V Leiden.  I have ordered a follow-up duplex of his IVC and iliac veins in 1 year and also an abdominal x-ray given his IVC filter.  He knows to call sooner if he has problems.  HPI:   Andrew Goodman is a pleasant 54 y.o. male who I did a virtual visit with on 06/04/2019.  He had previously seen Dr. Lemar Livings.  He has a history of factor V Leiden.  He had a trauma in the late 1990s and had an IVC filter placed.  He was having issues with bilateral lower extremity swelling and had bilateral common and external iliac venous stents placed in 2015.  He was maintained on Eliquis since that time.  He had a duplex prior to this which showed some chronic thrombus in the left common iliac vein but a patent inferior vena cava and right common iliac vein.  Both external iliac veins were patent.  There were no plans to remove his filter.  When I saw him last the plan was to continue his Eliquis given his venous stents.  He also was to continue leg elevation and compression therapy.  I recommended a follow-up abdominal x-ray in 1 year to check his filter and also a duplex of his inferior vena cava and iliac veins.  He comes in today for that visit.  The patient has been doing well.  He had some swelling when he was on a trip to [place] went and was on his  feet for long hours every day but was wearing his compression stockings and elevating his legs and his overall his legs are feeling fine now.  He continues his Eliquis.  He does swim for exercise and also walks quite a bit.  He has no other specific complaints.  Past Medical History:  Diagnosis Date  . Allergy   . Anxiety   . Factor 5 Leiden mutation, heterozygous (HCC)   . GERD (gastroesophageal reflux disease)     Family History  Problem Relation Age of Onset  . Hypertension Father     SOCIAL HISTORY: Social History   Tobacco Use  . Smoking status: Never Smoker  . Smokeless tobacco: Never Used  Substance Use Topics  . Alcohol use: Yes    Comment: 6 pack weekend    No Known Allergies  Current Outpatient Medications  Medication Sig Dispense Refill  . ALPRAZolam (XANAX) 1 MG tablet 1/2 to 1 tablet    . apixaban (ELIQUIS) 5 MG TABS tablet Take 1 tablet by mouth 2 (two) times daily.    Marland Kitchen aspirin 325 MG tablet Take 325 mg by mouth every 6 (six) hours as needed for mild pain.    . Azelastine-Fluticasone 137-50 MCG/ACT SUSP Place 1 spray into the nose daily.    Marland Kitchen  famotidine (PEPCID) 20 MG tablet Take 20 mg by mouth at bedtime.    Marland Kitchen levocetirizine (XYZAL) 5 MG tablet 1 tablet in the evening    . montelukast (SINGULAIR) 10 MG tablet 1 tablet    . Multiple Vitamin (MULTIVITAMIN) tablet Take 1 tablet by mouth daily.    . Omega-3 Fatty Acids (FISH OIL) 1000 MG CAPS 1 capsule    . pantoprazole (PROTONIX) 40 MG tablet Take 40 mg by mouth 2 (two) times daily.     No current facility-administered medications for this visit.    REVIEW OF SYSTEMS:  [X]  denotes positive finding, [ ]  denotes negative finding Cardiac  Comments:  Chest pain or chest pressure:    Shortness of breath upon exertion:    Short of breath when lying flat:    Irregular heart rhythm:        Vascular    Pain in calf, thigh, or hip brought on by ambulation: x   Pain in feet at night that wakes you up from  your sleep:     Blood clot in your veins:    Leg swelling:         Pulmonary    Oxygen at home:    Productive cough:     Wheezing:         Neurologic    Sudden weakness in arms or legs:     Sudden numbness in arms or legs:     Sudden onset of difficulty speaking or slurred speech:    Temporary loss of vision in one eye:     Problems with dizziness:         Gastrointestinal    Blood in stool:     Vomited blood:         Genitourinary    Burning when urinating:     Blood in urine:        Psychiatric    Major depression:         Hematologic    Bleeding problems:    Problems with blood clotting too easily:        Skin    Rashes or ulcers:        Constitutional    Fever or chills:     PHYSICAL EXAM:   Vitals:   09/21/20 0822  BP: 134/86  Pulse: 62  Resp: 20  Temp: 98.2 F (36.8 C)  SpO2: 97%  Weight: 194 lb (88 kg)  Height: 5\' 8"  (1.727 m)    GENERAL: The patient is a well-nourished male, in no acute distress. The vital signs are documented above. CARDIAC: There is a regular rate and rhythm.  VASCULAR: I do not detect carotid bruits. He has mild bilateral lower extremity swelling. He has some small varicose veins bilaterally. He has palpable dorsalis pedis and posterior tibial pulses bilaterally. PULMONARY: There is good air exchange bilaterally without wheezing or rales. ABDOMEN: Soft and non-tender with normal pitched bowel sounds.  MUSCULOSKELETAL: There are no major deformities or cyanosis. NEUROLOGIC: No focal weakness or paresthesias are detected. SKIN: There are no ulcers or rashes noted. PSYCHIATRIC: The patient has a normal affect.  DATA:    ABDOMINAL X-RAY: I reviewed his abdominal x-ray that was done on 05/25/2020.  This showed that the filter was in good position.  The top of the filter was between L2 and L3.  DUPLEX IVC AND ILIAC VEINS: I have independently interpreted his duplex of the IVC and iliac veins.  There is no thrombus involving the  inferior vena cava, with the exception of some hypoechoic areas within the filter.  This could potentially suggest a small amount of thrombus.  The right common iliac and external iliac veins are patent.  On the left side there is some chronic clot in the distal left common iliac vein which is not new.  The external iliac vein is patent.   Waverly Ferrari [facility] [phone]

## 2021-01-09 DIAGNOSIS — M545 Low back pain, unspecified: Secondary | ICD-10-CM | POA: Diagnosis not present

## 2021-03-02 DIAGNOSIS — Z131 Encounter for screening for diabetes mellitus: Secondary | ICD-10-CM | POA: Diagnosis not present

## 2021-03-02 DIAGNOSIS — Z0000 Encounter for general adult medical examination without abnormal findings: Secondary | ICD-10-CM | POA: Diagnosis not present

## 2021-05-11 ENCOUNTER — Ambulatory Visit
Admission: RE | Admit: 2021-05-11 | Discharge: 2021-05-11 | Disposition: A | Discharge status: Home / Self Care | Payer: BC Managed Care – PPO | Source: Ambulatory Visit | Attending: Family Medicine | Admitting: Family Medicine

## 2021-05-11 ENCOUNTER — Other Ambulatory Visit: Payer: Self-pay | Admitting: Family Medicine

## 2021-05-11 DIAGNOSIS — M542 Cervicalgia: Secondary | ICD-10-CM | POA: Diagnosis not present

## 2021-05-22 ENCOUNTER — Emergency Department (HOSPITAL_BASED_OUTPATIENT_CLINIC_OR_DEPARTMENT_OTHER)
Admission: EM | Admit: 2021-05-22 | Discharge: 2021-05-22 | Disposition: A | Discharge status: Home / Self Care | Payer: BC Managed Care – PPO | Attending: Emergency Medicine | Admitting: Emergency Medicine

## 2021-05-22 ENCOUNTER — Encounter (HOSPITAL_BASED_OUTPATIENT_CLINIC_OR_DEPARTMENT_OTHER): Payer: Self-pay | Admitting: *Deleted

## 2021-05-22 ENCOUNTER — Other Ambulatory Visit: Payer: Self-pay

## 2021-05-22 ENCOUNTER — Emergency Department (HOSPITAL_BASED_OUTPATIENT_CLINIC_OR_DEPARTMENT_OTHER): Payer: BC Managed Care – PPO | Admitting: Radiology

## 2021-05-22 DIAGNOSIS — Z5321 Procedure and treatment not carried out due to patient leaving prior to being seen by health care provider: Secondary | ICD-10-CM | POA: Diagnosis not present

## 2021-05-22 DIAGNOSIS — Y9389 Activity, other specified: Secondary | ICD-10-CM | POA: Insufficient documentation

## 2021-05-22 DIAGNOSIS — M79644 Pain in right finger(s): Secondary | ICD-10-CM | POA: Diagnosis not present

## 2021-05-22 DIAGNOSIS — W19XXXA Unspecified fall, initial encounter: Secondary | ICD-10-CM | POA: Insufficient documentation

## 2021-05-22 NOTE — ED Triage Notes (Signed)
Playing on a moving cart, fell off landing on rt thumb, able to move thumb with pain.

## 2021-06-01 DIAGNOSIS — I10 Essential (primary) hypertension: Secondary | ICD-10-CM | POA: Diagnosis not present

## 2021-06-01 DIAGNOSIS — Z6830 Body mass index (BMI) 30.0-30.9, adult: Secondary | ICD-10-CM | POA: Diagnosis not present

## 2021-06-01 DIAGNOSIS — M47812 Spondylosis without myelopathy or radiculopathy, cervical region: Secondary | ICD-10-CM | POA: Diagnosis not present

## 2021-07-23 IMAGING — DX DG ABDOMEN ACUTE W/ 1V CHEST
3 series · 3 of 3 positions shown · non-contrast
Comparison: Abdominal radiographs May 22, 2019; CT angiogram
chest January 08, 2013

CLINICAL DATA: Pain

EXAM:
DG ABDOMEN ACUTE W/ 1V CHEST

[dg abd acute w/chest (1 of 3)]
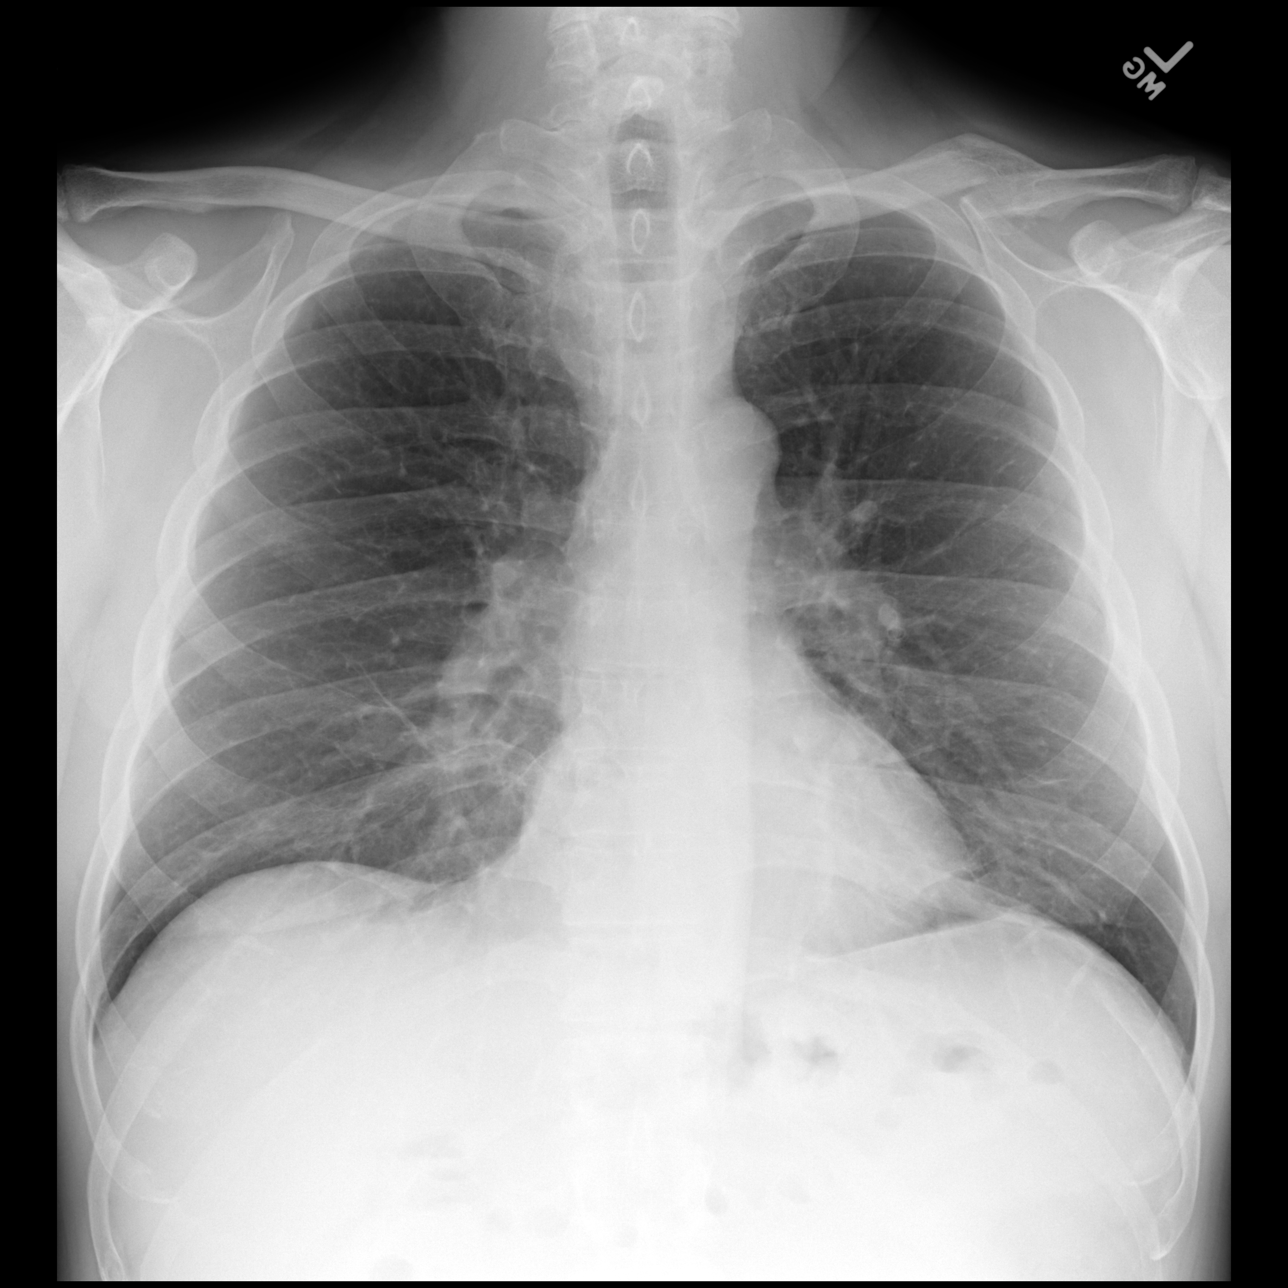

[dg abd acute w/chest (2 of 3)]
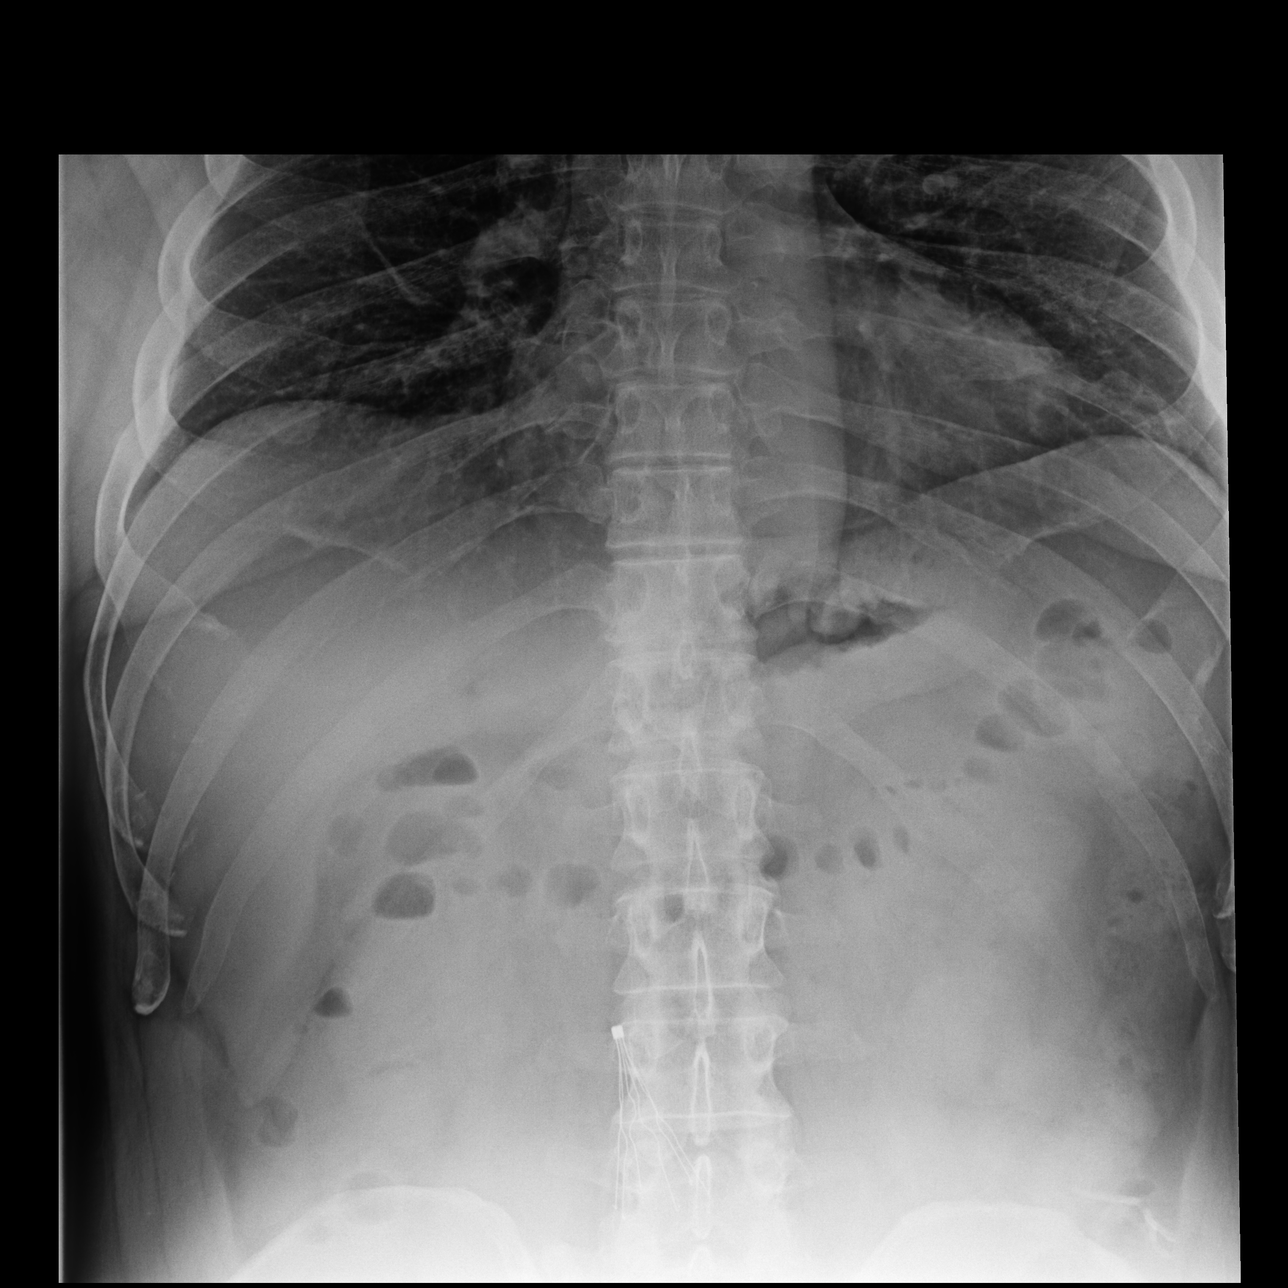

[dg abd acute w/chest (3 of 3)]
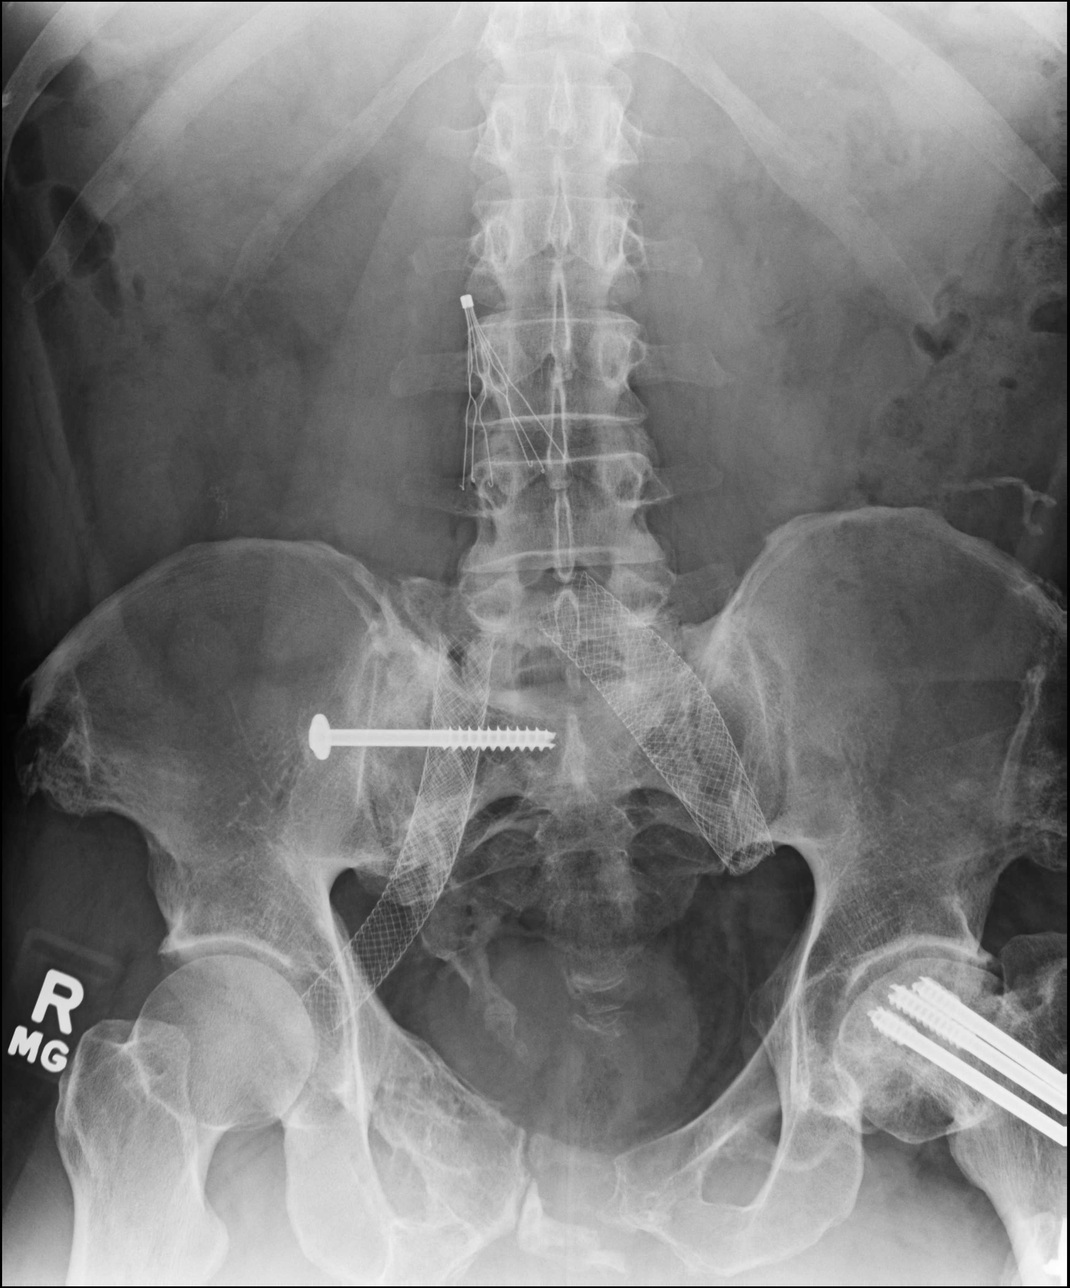

[3 of 3 positions shown; findings below may reference images not displayed]

FINDINGS: PA chest: There is slight scarring in the bases. Lungs elsewhere are
clear. Heart size and pulmonary vascular normal. No adenopathy.
There is an old healed fracture of the left clavicle with
remodeling.

Supine and upright abdomen: There is moderate stool in the colon.
There is no appreciable bowel dilatation. There are occasional
air-fluid levels. No free air. Stents noted in the iliac arteries
bilaterally. Filter noted in inferior vena cava. Postoperative
change in right sacroiliac joint region as well as in the proximal
left femur.
IMPRESSION: Scattered air-fluid levels that bowel dilatation. Question a degree
of ileus or enteritis. Bowel obstruction not felt to be likely.

Postoperative changes noted in the abdomen and pelvis.

Slight bibasilar lung scarring.  Lungs elsewhere clear.

## 2021-09-20 ENCOUNTER — Other Ambulatory Visit: Payer: Self-pay

## 2021-09-20 DIAGNOSIS — I82422 Acute embolism and thrombosis of left iliac vein: Secondary | ICD-10-CM

## 2021-09-28 ENCOUNTER — Encounter: Payer: Self-pay | Admitting: Vascular Surgery

## 2021-09-28 ENCOUNTER — Other Ambulatory Visit: Payer: Self-pay

## 2021-09-28 ENCOUNTER — Ambulatory Visit (HOSPITAL_COMMUNITY)
Admission: RE | Admit: 2021-09-28 | Discharge: 2021-09-28 | Disposition: A | Discharge status: Home / Self Care | Payer: BC Managed Care – PPO | Source: Ambulatory Visit | Attending: Vascular Surgery | Admitting: Vascular Surgery

## 2021-09-28 ENCOUNTER — Ambulatory Visit (INDEPENDENT_AMBULATORY_CARE_PROVIDER_SITE_OTHER): Payer: BC Managed Care – PPO | Admitting: Vascular Surgery

## 2021-09-28 VITALS — BP 136/92 | HR 62 | Temp 97.9°F | Resp 20 | Ht 68.0 in | Wt 200.0 lb

## 2021-09-28 DIAGNOSIS — I872 Venous insufficiency (chronic) (peripheral): Secondary | ICD-10-CM | POA: Diagnosis not present

## 2021-09-28 DIAGNOSIS — Z95828 Presence of other vascular implants and grafts: Secondary | ICD-10-CM | POA: Diagnosis not present

## 2021-09-28 DIAGNOSIS — I82422 Acute embolism and thrombosis of left iliac vein: Secondary | ICD-10-CM | POA: Insufficient documentation

## 2021-09-28 NOTE — Progress Notes (Signed)
REASON FOR VISIT:   Follow-up of venous disease  MEDICAL ISSUES:   HISTORY OF IVC FILTER/HISTORY OF BILATERAL ILIAC VENOUS STENTS: His IVC filter was placed in 1998.  He had bilateral iliac stents.  This is all patent.  He has some chronic clot in the left iliac system which is not new.  He remains on Eliquis for his factor V Leiden mutation.  I encouraged him to continue to elevate his legs and wear his compression stockings.  I have ordered a follow-up abdominal x-ray in 1 year and also a duplex of his IVC and iliac veins in 1 year.  I will see him back at that time.  He knows to call sooner if he has problems.  HPI:   Andrew Goodman is a pleasant 55 y.o. male who I last saw on 09/21/2020.  He has a history of an IVC filter and bilateral iliac venous stents.  At that time he was maintained on Eliquis.  His duplex scan looked good.  He did have some chronic clot in the left common iliac vein which was not noted.  His filter was in good position.  He has a factor V Leiden and therefore I felt he should stay on his Eliquis indefinitely.  I ordered a follow-up duplex of his IVC and iliac veins for 1 year and he comes in for that follow-up visit.   He has a history of factor V Leiden.  He had a trauma in 1998 and had an IVC filter placed.  He was having issues with bilateral lower extremity swelling and had bilateral common and external iliac venous stents placed in 2015.  He was maintained on Eliquis since that time.  He denies significant leg swelling.  His daughter was diagnosed with a factor S deficiency.  She also had a May Thurner syndrome and an atrial septal defect.  He has been wearing his compression stockings.  He does elevate his legs some.  He tries to exercise much as he can but his time is limited.  Past Medical History:  Diagnosis Date   Allergy    Anxiety    Factor 5 Leiden mutation, heterozygous (HCC)    GERD (gastroesophageal reflux disease)     Family History   Problem Relation Age of Onset   Hypertension Father     SOCIAL HISTORY: Social History   Tobacco Use   Smoking status: Never   Smokeless tobacco: Never  Substance Use Topics   Alcohol use: Yes    Comment: 6 pack weekend    No Known Allergies  Current Outpatient Medications  Medication Sig Dispense Refill   ALPRAZolam (XANAX) 1 MG tablet 1/2 to 1 tablet     apixaban (ELIQUIS) 5 MG TABS tablet Take 1 tablet by mouth 2 (two) times daily.     aspirin 325 MG tablet Take 325 mg by mouth every 6 (six) hours as needed for mild pain.     Azelastine-Fluticasone 137-50 MCG/ACT SUSP Place 1 spray into the nose daily.     famotidine (PEPCID) 20 MG tablet Take 20 mg by mouth at bedtime.     levocetirizine (XYZAL) 5 MG tablet 1 tablet in the evening     montelukast (SINGULAIR) 10 MG tablet 1 tablet     Multiple Vitamin (MULTIVITAMIN) tablet Take 1 tablet by mouth daily.     Omega-3 Fatty Acids (FISH OIL) 1000 MG CAPS 1 capsule     pantoprazole (PROTONIX) 40 MG tablet Take 40 mg  by mouth 2 (two) times daily.     No current facility-administered medications for this visit.    REVIEW OF SYSTEMS:  [X]  denotes positive finding, [ ]  denotes negative finding Cardiac  Comments:  Chest pain or chest pressure:    Shortness of breath upon exertion:    Short of breath when lying flat:    Irregular heart rhythm:        Vascular    Pain in calf, thigh, or hip brought on by ambulation:    Pain in feet at night that wakes you up from your sleep:     Blood clot in your veins:    Leg swelling:  x       Pulmonary    Oxygen at home:    Productive cough:     Wheezing:         Neurologic    Sudden weakness in arms or legs:     Sudden numbness in arms or legs:     Sudden onset of difficulty speaking or slurred speech:    Temporary loss of vision in one eye:     Problems with dizziness:         Gastrointestinal    Blood in stool:     Vomited blood:         Genitourinary    Burning when  urinating:     Blood in urine:        Psychiatric    Major depression:         Hematologic    Bleeding problems:    Problems with blood clotting too easily:        Skin    Rashes or ulcers:        Constitutional    Fever or chills:     PHYSICAL EXAM:   Vitals:   09/28/21 0824  BP: (!) 136/92  Pulse: 62  Resp: 20  Temp: 97.9 F (36.6 C)  SpO2: 98%  Weight: 200 lb (90.7 kg)  Height: 5\' 8"  (1.727 m)    GENERAL: The patient is a well-nourished male, in no acute distress. The vital signs are documented above. CARDIAC: There is a regular rate and rhythm.  VASCULAR: I do not detect carotid bruits. He has palpable pedal pulses. He has some varicose veins bilaterally. He has mild bilateral lower extremity swelling. There is no hyperpigmentation. PULMONARY: There is good air exchange bilaterally without wheezing or rales. ABDOMEN: Soft and non-tender with normal pitched bowel sounds.  MUSCULOSKELETAL: There are no major deformities or cyanosis. NEUROLOGIC: No focal weakness or paresthesias are detected. SKIN: There are no ulcers or rashes noted. PSYCHIATRIC: The patient has a normal affect.  DATA:    DUPLEX IVC AND ILIAC VEINS: I available interpreted his duplex of the IVC and iliac veins today.  There is no evidence of thrombus in the right common or external iliac vein.  There is some chronic thrombus observed in the left external iliac vein.  The IVC is patent.  [facility] [phone]

## 2022-02-09 DIAGNOSIS — Z20822 Contact with and (suspected) exposure to covid-19: Secondary | ICD-10-CM | POA: Diagnosis not present

## 2022-02-09 DIAGNOSIS — R0981 Nasal congestion: Secondary | ICD-10-CM | POA: Diagnosis not present

## 2022-02-09 DIAGNOSIS — M79605 Pain in left leg: Secondary | ICD-10-CM | POA: Diagnosis not present

## 2022-02-09 DIAGNOSIS — R102 Pelvic and perineal pain: Secondary | ICD-10-CM | POA: Diagnosis not present

## 2022-02-09 DIAGNOSIS — M25551 Pain in right hip: Secondary | ICD-10-CM | POA: Diagnosis not present

## 2022-03-08 DIAGNOSIS — G4700 Insomnia, unspecified: Secondary | ICD-10-CM | POA: Diagnosis not present

## 2022-03-08 DIAGNOSIS — J309 Allergic rhinitis, unspecified: Secondary | ICD-10-CM | POA: Diagnosis not present

## 2022-03-08 DIAGNOSIS — D6851 Activated protein C resistance: Secondary | ICD-10-CM | POA: Diagnosis not present

## 2022-03-08 DIAGNOSIS — Z0000 Encounter for general adult medical examination without abnormal findings: Secondary | ICD-10-CM | POA: Diagnosis not present

## 2022-03-23 ENCOUNTER — Ambulatory Visit
Admission: RE | Admit: 2022-03-23 | Discharge: 2022-03-23 | Disposition: A | Discharge status: Home / Self Care | Payer: BC Managed Care – PPO | Source: Ambulatory Visit | Attending: Family Medicine | Admitting: Family Medicine

## 2022-03-23 ENCOUNTER — Other Ambulatory Visit: Payer: Self-pay | Admitting: Family Medicine

## 2022-03-23 DIAGNOSIS — R102 Pelvic and perineal pain: Secondary | ICD-10-CM

## 2022-03-23 DIAGNOSIS — M79605 Pain in left leg: Secondary | ICD-10-CM

## 2022-03-23 DIAGNOSIS — S72002A Fracture of unspecified part of neck of left femur, initial encounter for closed fracture: Secondary | ICD-10-CM | POA: Diagnosis not present

## 2022-06-08 DIAGNOSIS — J342 Deviated nasal septum: Secondary | ICD-10-CM | POA: Diagnosis not present

## 2022-06-08 DIAGNOSIS — J343 Hypertrophy of nasal turbinates: Secondary | ICD-10-CM | POA: Diagnosis not present

## 2022-06-08 DIAGNOSIS — J310 Chronic rhinitis: Secondary | ICD-10-CM | POA: Diagnosis not present

## 2022-06-27 ENCOUNTER — Other Ambulatory Visit: Payer: Self-pay | Admitting: Otolaryngology

## 2022-08-02 ENCOUNTER — Encounter (HOSPITAL_BASED_OUTPATIENT_CLINIC_OR_DEPARTMENT_OTHER): Payer: Self-pay | Admitting: Otolaryngology

## 2022-08-02 ENCOUNTER — Other Ambulatory Visit: Payer: Self-pay

## 2022-08-08 DIAGNOSIS — D6851 Activated protein C resistance: Secondary | ICD-10-CM | POA: Diagnosis not present

## 2022-08-13 ENCOUNTER — Encounter (HOSPITAL_BASED_OUTPATIENT_CLINIC_OR_DEPARTMENT_OTHER)
Admission: RE | Disposition: A | Discharge status: Home / Self Care | Payer: Self-pay | Source: Home / Self Care | Attending: Otolaryngology

## 2022-08-13 ENCOUNTER — Encounter (HOSPITAL_BASED_OUTPATIENT_CLINIC_OR_DEPARTMENT_OTHER): Payer: Self-pay | Admitting: Otolaryngology

## 2022-08-13 ENCOUNTER — Ambulatory Visit (HOSPITAL_BASED_OUTPATIENT_CLINIC_OR_DEPARTMENT_OTHER): Payer: BC Managed Care – PPO | Admitting: Anesthesiology

## 2022-08-13 ENCOUNTER — Other Ambulatory Visit: Payer: Self-pay

## 2022-08-13 ENCOUNTER — Ambulatory Visit (HOSPITAL_BASED_OUTPATIENT_CLINIC_OR_DEPARTMENT_OTHER)
Admission: RE | Admit: 2022-08-13 | Discharge: 2022-08-13 | Disposition: A | Discharge status: Home / Self Care | Payer: BC Managed Care – PPO | Attending: Otolaryngology | Admitting: Otolaryngology

## 2022-08-13 DIAGNOSIS — D6851 Activated protein C resistance: Secondary | ICD-10-CM | POA: Diagnosis not present

## 2022-08-13 DIAGNOSIS — I739 Peripheral vascular disease, unspecified: Secondary | ICD-10-CM | POA: Diagnosis not present

## 2022-08-13 DIAGNOSIS — J3489 Other specified disorders of nose and nasal sinuses: Secondary | ICD-10-CM | POA: Diagnosis not present

## 2022-08-13 DIAGNOSIS — J310 Chronic rhinitis: Secondary | ICD-10-CM | POA: Diagnosis not present

## 2022-08-13 DIAGNOSIS — Z86718 Personal history of other venous thrombosis and embolism: Secondary | ICD-10-CM | POA: Diagnosis not present

## 2022-08-13 DIAGNOSIS — J342 Deviated nasal septum: Secondary | ICD-10-CM | POA: Insufficient documentation

## 2022-08-13 DIAGNOSIS — Z7901 Long term (current) use of anticoagulants: Secondary | ICD-10-CM | POA: Diagnosis not present

## 2022-08-13 DIAGNOSIS — J343 Hypertrophy of nasal turbinates: Secondary | ICD-10-CM | POA: Diagnosis not present

## 2022-08-13 DIAGNOSIS — Z01818 Encounter for other preprocedural examination: Secondary | ICD-10-CM

## 2022-08-13 HISTORY — PX: NASAL SEPTOPLASTY W/ TURBINOPLASTY: SHX2070

## 2022-08-13 HISTORY — DX: Peripheral vascular disease, unspecified: I73.9

## 2022-08-13 SURGERY — SEPTOPLASTY, NOSE, WITH NASAL TURBINATE REDUCTION
Anesthesia: General | Site: Nose | Laterality: Bilateral

## 2022-08-13 MED ORDER — DEXAMETHASONE SODIUM PHOSPHATE 4 MG/ML IJ SOLN
INTRAMUSCULAR | Status: DC | PRN
  Administered 2022-08-13: 10 mg via INTRAVENOUS

## 2022-08-13 MED ORDER — PROMETHAZINE HCL 25 MG/ML IJ SOLN: 6.2500 mg | INTRAMUSCULAR | Status: DC | PRN

## 2022-08-13 MED ORDER — AMISULPRIDE (ANTIEMETIC) 5 MG/2ML IV SOLN: 10.0000 mg | Freq: Once | INTRAVENOUS | Status: DC | PRN

## 2022-08-13 MED ORDER — OXYCODONE HCL 5 MG PO TABS
ORAL_TABLET | ORAL | Status: AC
  Filled 2022-08-13: qty 1

## 2022-08-13 MED ORDER — FENTANYL CITRATE (PF) 100 MCG/2ML IJ SOLN
INTRAMUSCULAR | Status: DC | PRN
  Administered 2022-08-13: 100 ug via INTRAVENOUS
  Administered 2022-08-13: 50 ug via INTRAVENOUS

## 2022-08-13 MED ORDER — ACETAMINOPHEN 500 MG PO TABS
ORAL_TABLET | ORAL | Status: AC
  Filled 2022-08-13: qty 2

## 2022-08-13 MED ORDER — OXYMETAZOLINE HCL 0.05 % NA SOLN
NASAL | Status: DC | PRN
  Administered 2022-08-13: 1 via TOPICAL

## 2022-08-13 MED ORDER — FENTANYL CITRATE (PF) 100 MCG/2ML IJ SOLN
INTRAMUSCULAR | Status: AC
  Filled 2022-08-13: qty 2

## 2022-08-13 MED ORDER — ROCURONIUM BROMIDE 100 MG/10ML IV SOLN
INTRAVENOUS | Status: DC | PRN
  Administered 2022-08-13: 60 mg via INTRAVENOUS

## 2022-08-13 MED ORDER — PROPOFOL 10 MG/ML IV BOLUS
INTRAVENOUS | Status: AC
  Filled 2022-08-13: qty 20

## 2022-08-13 MED ORDER — MIDAZOLAM HCL 5 MG/5ML IJ SOLN
INTRAMUSCULAR | Status: DC | PRN
  Administered 2022-08-13: 2 mg via INTRAVENOUS

## 2022-08-13 MED ORDER — MUPIROCIN 2 % EX OINT
TOPICAL_OINTMENT | CUTANEOUS | Status: AC
  Filled 2022-08-13: qty 44

## 2022-08-13 MED ORDER — MIDAZOLAM HCL 2 MG/2ML IJ SOLN
INTRAMUSCULAR | Status: AC
  Filled 2022-08-13: qty 2

## 2022-08-13 MED ORDER — OXYCODONE-ACETAMINOPHEN 5-325 MG PO TABS: 1.0000 | ORAL_TABLET | ORAL | 0 refills | Status: AC | PRN

## 2022-08-13 MED ORDER — OXYMETAZOLINE HCL 0.05 % NA SOLN
NASAL | Status: AC
  Filled 2022-08-13: qty 60

## 2022-08-13 MED ORDER — OXYCODONE HCL 5 MG PO TABS
5.0000 mg | ORAL_TABLET | Freq: Once | ORAL | Status: AC | PRN
  Administered 2022-08-13: 5 mg via ORAL

## 2022-08-13 MED ORDER — LIDOCAINE-EPINEPHRINE 1 %-1:100000 IJ SOLN
INTRAMUSCULAR | Status: DC | PRN
  Administered 2022-08-13: 7.5 mL

## 2022-08-13 MED ORDER — LACTATED RINGERS IV SOLN: INTRAVENOUS | Status: DC

## 2022-08-13 MED ORDER — ROCURONIUM BROMIDE 10 MG/ML (PF) SYRINGE
PREFILLED_SYRINGE | INTRAVENOUS | Status: AC
  Filled 2022-08-13: qty 10

## 2022-08-13 MED ORDER — LIDOCAINE HCL (CARDIAC) PF 100 MG/5ML IV SOSY
PREFILLED_SYRINGE | INTRAVENOUS | Status: DC | PRN
  Administered 2022-08-13: 100 mg via INTRAVENOUS

## 2022-08-13 MED ORDER — FENTANYL CITRATE (PF) 100 MCG/2ML IJ SOLN
25.0000 ug | INTRAMUSCULAR | Status: DC | PRN
  Administered 2022-08-13 (×3): 50 ug via INTRAVENOUS

## 2022-08-13 MED ORDER — ONDANSETRON HCL 4 MG/2ML IJ SOLN
INTRAMUSCULAR | Status: DC | PRN
  Administered 2022-08-13: 4 mg via INTRAVENOUS

## 2022-08-13 MED ORDER — LIDOCAINE 2% (20 MG/ML) 5 ML SYRINGE
INTRAMUSCULAR | Status: AC
  Filled 2022-08-13: qty 5

## 2022-08-13 MED ORDER — MUPIROCIN 2 % EX OINT
TOPICAL_OINTMENT | CUTANEOUS | Status: DC | PRN
  Administered 2022-08-13: 1 via TOPICAL

## 2022-08-13 MED ORDER — CEFAZOLIN SODIUM 1 G IJ SOLR
INTRAMUSCULAR | Status: AC
  Filled 2022-08-13: qty 20

## 2022-08-13 MED ORDER — SUGAMMADEX SODIUM 500 MG/5ML IV SOLN
INTRAVENOUS | Status: AC
  Filled 2022-08-13: qty 5

## 2022-08-13 MED ORDER — PROPOFOL 10 MG/ML IV BOLUS
INTRAVENOUS | Status: DC | PRN
  Administered 2022-08-13: 200 mg via INTRAVENOUS

## 2022-08-13 MED ORDER — AMOXICILLIN 875 MG PO TABS: 875.0000 mg | ORAL_TABLET | Freq: Two times a day (BID) | ORAL | 0 refills | Status: AC

## 2022-08-13 MED ORDER — LIDOCAINE-EPINEPHRINE 1 %-1:100000 IJ SOLN
INTRAMUSCULAR | Status: AC
  Filled 2022-08-13: qty 2

## 2022-08-13 MED ORDER — ACETAMINOPHEN 500 MG PO TABS
1000.0000 mg | ORAL_TABLET | Freq: Once | ORAL | Status: AC
  Administered 2022-08-13: 1000 mg via ORAL

## 2022-08-13 MED ORDER — SUGAMMADEX SODIUM 500 MG/5ML IV SOLN
INTRAVENOUS | Status: DC | PRN
  Administered 2022-08-13: 250 mg via INTRAVENOUS

## 2022-08-13 MED ORDER — OXYCODONE HCL 5 MG/5ML PO SOLN: 5.0000 mg | Freq: Once | ORAL | Status: AC | PRN

## 2022-08-13 MED ORDER — CEFAZOLIN SODIUM-DEXTROSE 2-3 GM-%(50ML) IV SOLR
INTRAVENOUS | Status: DC | PRN
  Administered 2022-08-13: 2 g via INTRAVENOUS

## 2022-08-13 SURGICAL SUPPLY — 33 items
ATTRACTOMAT 16X20 MAGNETIC DRP (DRAPES) IMPLANT
CANISTER SUCT 1200ML W/VALVE (MISCELLANEOUS) ×1 IMPLANT
COAGULATOR SUCT 8FR VV (MISCELLANEOUS) ×1 IMPLANT
DEFOGGER MIRROR 1QT (MISCELLANEOUS) ×1 IMPLANT
DRSG NASOPORE 8CM (GAUZE/BANDAGES/DRESSINGS) IMPLANT
DRSG TELFA 3X8 NADH STRL (GAUZE/BANDAGES/DRESSINGS) IMPLANT
ELECT REM PT RETURN 9FT ADLT (ELECTROSURGICAL) ×1
ELECTRODE REM PT RTRN 9FT ADLT (ELECTROSURGICAL) ×1 IMPLANT
GLOVE BIO SURGEON STRL SZ7.5 (GLOVE) ×1 IMPLANT
GLOVE BIOGEL PI IND STRL 7.0 (GLOVE) IMPLANT
GLOVE BIOGEL PI IND STRL 7.5 (GLOVE) IMPLANT
GLOVE SURG SS PI 6.5 STRL IVOR (GLOVE) IMPLANT
GOWN STRL REUS W/ TWL LRG LVL3 (GOWN DISPOSABLE) ×2 IMPLANT
GOWN STRL REUS W/TWL LRG LVL3 (GOWN DISPOSABLE) ×3
NDL HYPO 25X1 1.5 SAFETY (NEEDLE) ×1 IMPLANT
NEEDLE HYPO 25X1 1.5 SAFETY (NEEDLE) ×1 IMPLANT
NS IRRIG 1000ML POUR BTL (IV SOLUTION) ×1 IMPLANT
PACK BASIN DAY SURGERY FS (CUSTOM PROCEDURE TRAY) ×1 IMPLANT
PACK ENT DAY SURGERY (CUSTOM PROCEDURE TRAY) ×1 IMPLANT
SLEEVE SCD COMPRESS KNEE MED (STOCKING) IMPLANT
SPIKE FLUID TRANSFER (MISCELLANEOUS) IMPLANT
SPLINT NASAL AIRWAY SILICONE (MISCELLANEOUS) ×1 IMPLANT
SPONGE GAUZE 2X2 8PLY STRL LF (GAUZE/BANDAGES/DRESSINGS) ×1 IMPLANT
SPONGE NEURO XRAY DETECT 1X3 (DISPOSABLE) ×1 IMPLANT
SUT CHROMIC 4 0 P 3 18 (SUTURE) ×1 IMPLANT
SUT PLAIN 4 0 ~~LOC~~ 1 (SUTURE) ×1 IMPLANT
SUT PROLENE 3 0 PS 2 (SUTURE) ×1 IMPLANT
SUT VIC AB 4-0 P-3 18XBRD (SUTURE) IMPLANT
SUT VIC AB 4-0 P3 18 (SUTURE)
TOWEL GREEN STERILE FF (TOWEL DISPOSABLE) ×1 IMPLANT
TUBE SALEM SUMP 12R W/ARV (TUBING) IMPLANT
TUBE SALEM SUMP 16 FR W/ARV (TUBING) ×1 IMPLANT
YANKAUER SUCT BULB TIP NO VENT (SUCTIONS) ×1 IMPLANT

## 2022-08-13 NOTE — Discharge Instructions (Addendum)
POSTOPERATIVE INSTRUCTIONS FOR PATIENTS HAVING NASAL OR SINUS OPERATIONS ACTIVITY: Restrict activity at home for the first two days, resting as much as possible. Light activity is best. You may usually return to work within a week. You should refrain from nose blowing, strenuous activity, or heavy lifting greater than 20lbs for a total of one week after your operation.  If sneezing cannot be avoided, sneeze with your mouth open. DISCOMFORT: You may experience a dull headache and pressure along with nasal congestion and discharge. These symptoms may be worse during the first week after the operation but may last as long as two to four weeks.  Please take Tylenol or the pain medication that has been prescribed for you. Do not take aspirin or aspirin containing medications since they may cause bleeding.  You may experience symptoms of post nasal drainage, nasal congestion, headaches and fatigue for two or three months after your operation.  BLEEDING: You may have some blood tinged nasal drainage for approximately two weeks after the operation.  The discharge will be worse for the first week.  Please call our office at [phone] or go to the nearest hospital emergency room if you experience any of the following: heavy, bright red blood from your nose or mouth that lasts longer than 15 minutes or coughing up or vomiting bright red blood or blood clots. GENERAL CONSIDERATIONS: A gauze dressing will be placed on your upper lip to absorb any drainage after the operation. You may need to change this several times a day.  If you do not have very much drainage, you may remove the dressing.  Remember that you may gently wipe your nose with a tissue and sniff in, but DO NOT blow your nose. Please keep all of your postoperative appointments.  Your final results after the operation will depend on proper follow-up.  The initial visit is usually 2 to 5 days after the operation.  During this visit, the remaining nasal  packing and internal septal splints will be removed.  Your nasal and sinus cavities will be cleaned.  During the second visit, your nasal and sinus cavities will be cleaned again. Have someone drive you to your first two postoperative appointments.  How you care for your nose after the operation will influence the results that you obtain.  You should follow all directions, take your medication as prescribed, and call our office [phone] with any problems or questions. You may be more comfortable sleeping with your head elevated on two pillows. Do not take any medications that we have not prescribed or recommended. WARNING SIGNS: if any of the following should occur, please call our office: Persistent fever greater than 102F. Persistent vomiting. Severe and constant pain that is not relieved by prescribed pain medication. Trauma to the nose. Rash or unusual side effects from any medicines.    May have Tylenol today, 08/13/2022, after 12:45 PM today.     Post Anesthesia Home Care Instructions  Activity: Get plenty of rest for the remainder of the day. A responsible individual must stay with you for 24 hours following the procedure.  For the next 24 hours, DO NOT: -Drive a car -Paediatric nurse -Drink alcoholic beverages -Take any medication unless instructed by your physician -Make any legal decisions or sign important papers.  Meals: Start with liquid foods such as gelatin or soup. Progress to regular foods as tolerated. Avoid greasy, spicy, heavy foods. If nausea and/or vomiting occur, drink only clear liquids until the nausea and/or vomiting subsides. Call  your physician if vomiting continues.  Special Instructions/Symptoms: Your throat may feel dry or sore from the anesthesia or the breathing tube placed in your throat during surgery. If this causes discomfort, gargle with warm salt water. The discomfort should disappear within 24 hours.  If you had a scopolamine patch placed  behind your ear for the management of post- operative nausea and/or vomiting:  1. The medication in the patch is effective for 72 hours, after which it should be removed.  Wrap patch in a tissue and discard in the trash. Wash hands thoroughly with soap and water. 2. You may remove the patch earlier than 72 hours if you experience unpleasant side effects which may include dry mouth, dizziness or visual disturbances. 3. Avoid touching the patch. Wash your hands with soap and water after contact with the patch.

## 2022-08-13 NOTE — Anesthesia Procedure Notes (Signed)
Procedure Name: Intubation Date/Time: 08/13/2022 7:42 AM  Performed by: Ezequiel Kayser, CRNAPre-anesthesia Checklist: Patient identified, Emergency Drugs available, Suction available and Patient being monitored Patient Re-evaluated:Patient Re-evaluated prior to induction Oxygen Delivery Method: Circle System Utilized Preoxygenation: Pre-oxygenation with 100% oxygen Induction Type: IV induction Ventilation: Mask ventilation without difficulty Laryngoscope Size: Mac and 4 Grade View: Grade I Tube type: Oral Rae Tube size: 8.0 mm Number of attempts: 1 Airway Equipment and Method: Stylet and Oral airway Placement Confirmation: ETT inserted through vocal cords under direct vision, positive ETCO2 and breath sounds checked- equal and bilateral Secured at: 22 cm Tube secured with: Tape Dental Injury: Teeth and Oropharynx as per pre-operative assessment

## 2022-08-13 NOTE — Anesthesia Postprocedure Evaluation (Signed)
Anesthesia Post Note  Patient: Andrew Goodman  Procedure(s) Performed: NASAL SEPTOPLASTY WITH TURBINATE REDUCTION (Bilateral: Nose)     Patient location during evaluation: PACU Anesthesia Type: General Level of consciousness: awake Pain management: pain level controlled Vital Signs Assessment: post-procedure vital signs reviewed and stable Respiratory status: spontaneous breathing, nonlabored ventilation, respiratory function stable and patient connected to nasal cannula oxygen Cardiovascular status: blood pressure returned to baseline and stable Postop Assessment: no apparent nausea or vomiting Anesthetic complications: no   No notable events documented.  Last Vitals:  Vitals:   08/13/22 1002 08/13/22 1018  BP: (!) 135/91 (!) 154/96  Pulse: 78 75  Resp: 15 16  Temp:  36.5 C  SpO2: 97% 96%    Last Pain:  Vitals:   08/13/22 1018  TempSrc:   PainSc: 4                  Andrew Goodman P Jaleen Finch

## 2022-08-13 NOTE — Anesthesia Preprocedure Evaluation (Addendum)
Anesthesia Evaluation  Patient identified by MRN, date of birth, ID band Patient awake    Reviewed: Allergy & Precautions, NPO status , Patient's Chart, lab work & pertinent test results  Airway Mallampati: I  TM Distance: >3 FB Neck ROM: Full    Dental no notable dental hx.    Pulmonary    Pulmonary exam normal        Cardiovascular + Peripheral Vascular Disease and + DVT  Normal cardiovascular exam     Neuro/Psych   Anxiety        GI/Hepatic ,GERD  Medicated and Controlled,,  Endo/Other    Renal/GU      Musculoskeletal   Abdominal   Peds  Hematology  (+) Blood dyscrasia (Eliquis) Factor 5 Leiden   Anesthesia Other Findings DEVIATED SEPTUM  BILATERAL TURBINATE HYPERTROPHY    Reproductive/Obstetrics                             Anesthesia Physical Anesthesia Plan  ASA: 2  Anesthesia Plan: General   Post-op Pain Management:    Induction: Intravenous  PONV Risk Score and Plan: 3 and Ondansetron, Dexamethasone, Midazolam and Treatment may vary due to age or medical condition  Airway Management Planned: Oral ETT  Additional Equipment:   Intra-op Plan:   Post-operative Plan: Extubation in OR  Informed Consent: I have reviewed the patients History and Physical, chart, labs and discussed the procedure including the risks, benefits and alternatives for the proposed anesthesia with the patient or authorized representative who has indicated his/her understanding and acceptance.     Dental advisory given  Plan Discussed with: CRNA  Anesthesia Plan Comments:        Anesthesia Quick Evaluation

## 2022-08-13 NOTE — Transfer of Care (Signed)
Immediate Anesthesia Transfer of Care Note  Patient: Andrew Goodman  Procedure(s) Performed: NASAL SEPTOPLASTY WITH TURBINATE REDUCTION (Bilateral: Nose)  Patient Location: PACU  Anesthesia Type:General  Level of Consciousness: drowsy  Airway & Oxygen Therapy: Patient Spontanous Breathing and Patient connected to face mask oxygen  Post-op Assessment: Report given to RN and Post -op Vital signs reviewed and stable  Post vital signs: Reviewed and stable  Last Vitals:  Vitals Value Taken Time  BP 145/93   Temp 97.1   Pulse 85 08/13/22 0856  Resp 9 08/13/22 0856  SpO2 96 % 08/13/22 0856  Vitals shown include unvalidated device data.  Last Pain:  Vitals:   08/13/22 1840  TempSrc: Oral  PainSc: 0-No pain      Patients Stated Pain Goal: 6 (37/54/36 0677)  Complications: No notable events documented.

## 2022-08-13 NOTE — Op Note (Signed)
DATE OF PROCEDURE: 08/13/2022  OPERATIVE REPORT   SURGEON: Leta Baptist, MD   PREOPERATIVE DIAGNOSES:  1. Severe nasal septal deviation.  2. Bilateral inferior turbinate hypertrophy.  3. Chronic nasal obstruction.  POSTOPERATIVE DIAGNOSES:  1. Severe nasal septal deviation.  2. Bilateral inferior turbinate hypertrophy.  3. Chronic nasal obstruction.  PROCEDURE PERFORMED:  1. Septoplasty.  2. Bilateral partial inferior turbinate resection.   ANESTHESIA: General endotracheal tube anesthesia.   COMPLICATIONS: None.   ESTIMATED BLOOD LOSS: 100 mL.   INDICATION FOR PROCEDURE: Andrew Goodman is a 56 y.o. male with a history of chronic nasal obstruction. The patient was treated with antihistamine, decongestant, and steroid nasal sprays. However, the patient continued to be symptomatic. On examination, the patient was noted to have bilateral severe inferior turbinate hypertrophy and significant nasal septal deviation, causing significant nasal obstruction. Based on the above findings, the decision was made for the patient to undergo the above-stated procedures. The risks, benefits, alternatives, and details of the procedures were discussed with the patient. Questions were invited and answered. Informed consent was obtained.   DESCRIPTION OF PROCEDURE: The patient was taken to the operating room and placed supine on the operating table. General endotracheal tube anesthesia was administered by the anesthesiologist. The patient was positioned, and prepped and draped in the standard fashion for nasal surgery. Pledgets soaked with Afrin were placed in both nasal cavities for decongestion. The pledgets were subsequently removed.   Examination of the nasal cavity revealed a severe nasal septal deviation. 1% lidocaine with 1:100,000 epinephrine was injected onto the nasal septum bilaterally. A hemitransfixion incision was made on the left side. The mucosal flap was carefully elevated on the left side. A  cartilaginous incision was made 1 cm superior to the caudal margin of the nasal septum. Mucosal flap was also elevated on the right side in the similar fashion. It should be noted that due to the severe septal deviation, the deviated portion of the cartilaginous and bony septum had to be removed in piecemeal fashion. Once the deviated portions were removed, a straight midline septum was achieved. The septum was then quilted with 4-0 plain gut sutures. The hemitransfixion incision was closed with interrupted 4-0 chromic sutures.   The inferior one half of both hypertrophied inferior turbinate was crossclamped with a Kelly clamp. The inferior one half of each inferior turbinate was then resected with a pair of cross cutting scissors. Hemostasis was achieved with a suction cautery device. Doyle splints were applied to the nasal septum.  The care of the patient was turned over to the anesthesiologist. The patient was awakened from anesthesia without difficulty. The patient was extubated and transferred to the recovery room in good condition.   OPERATIVE FINDINGS: Severe nasal septal deviation and bilateral inferior turbinate hypertrophy.   SPECIMEN: None.   FOLLOWUP CARE: The patient be discharged home once he is awake and alert. The patient will be placed on Percocet p.r.n. pain, and amoxicillin 875 mg p.o. b.i.d. for 3 days. The patient will follow up in my office in 3 days for splint removal.   Andrew Dreese Raynelle Bring, MD

## 2022-08-13 NOTE — H&P (Signed)
Cc: Chronic nasal obstruction  HPI: The patient is a 56 year old male who presents today complaining of chronic nasal obstruction for 30+ years.  According to the patient, he had a history of nasal fracture more than 30 years ago.  Since the injury, he has been experiencing progressive difficulty with his nasal breathing.  He is a habitual mouth breather.  He also has a history of seasonal allergies.  He has been using Xyzal and Flonase for many years.  He has no previous ENT surgery.  He has a history of factor V Leiden thrombophilia.  Currently he is on Eliquis.  The patient previously underwent polysomnography study 5 years ago.  It was negative for significant sleep apnea.    The patient's review of systems (constitutional, eyes, ENT, cardiovascular, respiratory, GI, musculoskeletal, skin, neurologic, psychiatric, endocrine, hematologic, allergic) is noted in the ROS questionnaire.  It is reviewed with the patient.  Major events: None.  Ongoing medical problems: Factor V Leiden, anxiety disorder.  Family health history: Factor V Leiden.  Social history: The patient is married. He denies the use of tobacco or illegal drugs. He drinks 1-2 alcoholic drink a day.   Exam: General: Communicates without difficulty, well nourished, no acute distress. Head: Normocephalic, no evidence injury, no tenderness, facial buttresses intact without stepoff. Face/sinus: No tenderness to palpation and percussion. Facial movement is normal and symmetric. Eyes: PERRL, EOMI. No scleral icterus, conjunctivae clear. Neuro: CN II exam reveals vision grossly intact.  No nystagmus at any point of gaze. Ears: Auricles well formed without lesions.  Ear canals are intact without mass or lesion.  No erythema or edema is appreciated.  The TMs are intact without fluid. Nose: External evaluation reveals normal support and skin without lesions.  Dorsum is intact.  Anterior rhinoscopy reveals congested mucosa over anterior aspect of  inferior turbinates and intact septum.  No purulence noted. Oral:  Oral cavity and oropharynx are intact, symmetric, without erythema or edema.  Mucosa is moist without lesions. Neck: Full range of motion without pain.  There is no significant lymphadenopathy.  No masses palpable.  Thyroid bed within normal limits to palpation.  Parotid glands and submandibular glands equal bilaterally without mass.  Trachea is midline. Neuro:  CN 2-12 grossly intact. Gait normal. A flexible scope was inserted into the right nasal cavity.  Endoscopy of the interior nasal cavity, superior, inferior, and middle meatus was performed. The sphenoid-ethmoid recess was examined. Edematous mucosa was noted.  No polyp, mass, or lesion was appreciated. Nasal septal deviation noted. Olfactory cleft was clear.  Nasopharynx was clear.  Turbinates were hypertrophied but without mass.  The procedure was repeated on the contralateral side with similar findings.  The patient tolerated the procedure well.   Assessment  1.  Chronic rhinitis with nasal mucosal congestion, nasal septal deviation and bilateral inferior turbinate hypertrophy.  More than 95% of his nasal passageways are obstructed bilaterally.  The patient has not responded to medical treatment for 10+ years.   2.  No polyps, mass, lesion, or purulent drainage is noted.   Plan  1.  The physical exam and nasal endoscopy findings are reviewed with the patient.  2.  Continue with Flonase nasal spray daily.  3.  Nasal saline irrigation is encouraged.  4.  If his symptoms persist, he may benefit from surgical intervention with septoplasty and bilateral turbinate reduction surgery.

## 2022-08-14 ENCOUNTER — Encounter (HOSPITAL_BASED_OUTPATIENT_CLINIC_OR_DEPARTMENT_OTHER): Payer: Self-pay | Admitting: Otolaryngology

## 2022-12-03 ENCOUNTER — Other Ambulatory Visit: Payer: Self-pay

## 2022-12-03 DIAGNOSIS — Z95828 Presence of other vascular implants and grafts: Secondary | ICD-10-CM
